# Patient Record
Sex: Female | Born: 1975 | Race: White | Hispanic: Yes | Marital: Married | Smoking: Never smoker
Health system: Southern US, Community
[De-identification: ages and names within clinical notes are randomized; demographics above are authoritative.]

## PROBLEM LIST (undated history)

## (undated) DIAGNOSIS — E7800 Pure hypercholesterolemia, unspecified: Secondary | ICD-10-CM

## (undated) DIAGNOSIS — F329 Major depressive disorder, single episode, unspecified: Secondary | ICD-10-CM

## (undated) DIAGNOSIS — I10 Essential (primary) hypertension: Secondary | ICD-10-CM

## (undated) DIAGNOSIS — B351 Tinea unguium: Secondary | ICD-10-CM

## (undated) DIAGNOSIS — F32A Depression, unspecified: Secondary | ICD-10-CM

## (undated) DIAGNOSIS — E119 Type 2 diabetes mellitus without complications: Secondary | ICD-10-CM

## (undated) HISTORY — DX: Type 2 diabetes mellitus without complications: E11.9

## (undated) HISTORY — DX: Tinea unguium: B35.1

## (undated) HISTORY — DX: Essential (primary) hypertension: I10

## (undated) HISTORY — DX: Pure hypercholesterolemia, unspecified: E78.00

---

## 2003-07-11 ENCOUNTER — Encounter: Admission: RE | Admit: 2003-07-11 | Discharge: 2003-07-11 | Payer: Self-pay | Admitting: Internal Medicine

## 2003-08-28 ENCOUNTER — Encounter: Admission: RE | Admit: 2003-08-28 | Discharge: 2003-08-28 | Payer: Self-pay | Admitting: Internal Medicine

## 2003-11-30 ENCOUNTER — Encounter: Admission: RE | Admit: 2003-11-30 | Discharge: 2003-11-30 | Payer: Self-pay | Admitting: Internal Medicine

## 2003-12-01 ENCOUNTER — Encounter: Admission: RE | Admit: 2003-12-01 | Discharge: 2003-12-01 | Payer: Self-pay | Admitting: Internal Medicine

## 2004-02-21 ENCOUNTER — Ambulatory Visit: Payer: Self-pay

## 2006-08-07 ENCOUNTER — Emergency Department (HOSPITAL_COMMUNITY): Admission: EM | Admit: 2006-08-07 | Discharge: 2006-08-07 | Payer: Self-pay | Admitting: Emergency Medicine

## 2006-08-19 ENCOUNTER — Inpatient Hospital Stay (HOSPITAL_COMMUNITY): Admission: AD | Admit: 2006-08-19 | Discharge: 2006-08-19 | Payer: Self-pay | Admitting: Obstetrics and Gynecology

## 2006-10-27 ENCOUNTER — Encounter: Admission: RE | Admit: 2006-10-27 | Discharge: 2006-11-19 | Payer: Self-pay | Admitting: Orthopedic Surgery

## 2006-11-13 ENCOUNTER — Inpatient Hospital Stay (HOSPITAL_COMMUNITY): Admission: AD | Admit: 2006-11-13 | Discharge: 2006-11-13 | Payer: Self-pay | Admitting: Family Medicine

## 2007-01-01 ENCOUNTER — Encounter: Admission: RE | Admit: 2007-01-01 | Discharge: 2007-01-01 | Payer: Self-pay | Admitting: Obstetrics

## 2007-06-21 ENCOUNTER — Inpatient Hospital Stay (HOSPITAL_COMMUNITY): Admission: AD | Admit: 2007-06-21 | Discharge: 2007-06-24 | Payer: Self-pay | Admitting: Obstetrics

## 2007-06-21 ENCOUNTER — Encounter (INDEPENDENT_AMBULATORY_CARE_PROVIDER_SITE_OTHER): Payer: Self-pay | Admitting: Obstetrics

## 2007-09-06 ENCOUNTER — Encounter: Admission: RE | Admit: 2007-09-06 | Discharge: 2007-09-06 | Payer: Self-pay | Admitting: Obstetrics

## 2007-11-15 ENCOUNTER — Ambulatory Visit: Payer: Self-pay | Admitting: Family Medicine

## 2007-11-15 DIAGNOSIS — E669 Obesity, unspecified: Secondary | ICD-10-CM

## 2007-11-15 DIAGNOSIS — E1110 Type 2 diabetes mellitus with ketoacidosis without coma: Secondary | ICD-10-CM

## 2007-11-15 DIAGNOSIS — N63 Unspecified lump in unspecified breast: Secondary | ICD-10-CM | POA: Insufficient documentation

## 2007-11-15 LAB — CONVERTED CEMR LAB: Hgb A1c MFr Bld: 8.6 %

## 2007-12-13 ENCOUNTER — Ambulatory Visit: Payer: Self-pay | Admitting: Family Medicine

## 2007-12-13 DIAGNOSIS — D2360 Other benign neoplasm of skin of unspecified upper limb, including shoulder: Secondary | ICD-10-CM

## 2007-12-13 LAB — CONVERTED CEMR LAB
ALT: 11 U/L
AST: 13 U/L
Albumin: 4.5 g/dL
Alkaline Phosphatase: 61 U/L
BUN: 9 mg/dL
CO2: 23 meq/L
Calcium: 9.1 mg/dL
Chloride: 102 meq/L
Creatinine, Ser: 0.6 mg/dL
Glucose, Bld: 205 mg/dL — ABNORMAL HIGH
Potassium: 4.1 meq/L
Sodium: 138 meq/L
Total Bilirubin: 0.5 mg/dL
Total Protein: 8 g/dL

## 2008-01-12 ENCOUNTER — Encounter: Payer: Self-pay | Admitting: Family Medicine

## 2008-01-31 ENCOUNTER — Ambulatory Visit: Payer: Self-pay | Admitting: Family Medicine

## 2008-01-31 LAB — CONVERTED CEMR LAB
HDL: 62 mg/dL (ref >39)
Total CHOL/HDL Ratio: 3.1
VLDL: 18 mg/dL (ref 0–40)

## 2008-03-01 ENCOUNTER — Encounter: Payer: Self-pay | Admitting: Family Medicine

## 2008-03-14 ENCOUNTER — Ambulatory Visit: Payer: Self-pay | Admitting: Family Medicine

## 2008-03-27 ENCOUNTER — Ambulatory Visit: Payer: Self-pay | Admitting: Family Medicine

## 2008-03-27 DIAGNOSIS — B353 Tinea pedis: Secondary | ICD-10-CM

## 2008-04-03 ENCOUNTER — Ambulatory Visit: Payer: Self-pay | Admitting: Family Medicine

## 2008-04-10 ENCOUNTER — Ambulatory Visit: Payer: Self-pay | Admitting: Family Medicine

## 2008-04-12 ENCOUNTER — Telehealth (INDEPENDENT_AMBULATORY_CARE_PROVIDER_SITE_OTHER): Payer: Self-pay | Admitting: Family Medicine

## 2008-06-05 ENCOUNTER — Encounter: Admission: RE | Admit: 2008-06-05 | Discharge: 2008-06-05 | Payer: Self-pay | Admitting: Surgery

## 2008-06-07 ENCOUNTER — Encounter (INDEPENDENT_AMBULATORY_CARE_PROVIDER_SITE_OTHER): Payer: Self-pay | Admitting: Surgery

## 2008-06-07 ENCOUNTER — Encounter: Admission: RE | Admit: 2008-06-07 | Discharge: 2008-06-07 | Payer: Self-pay | Admitting: Surgery

## 2008-06-08 HISTORY — PX: BREAST BIOPSY: SHX20

## 2008-09-18 ENCOUNTER — Encounter: Admission: RE | Admit: 2008-09-18 | Discharge: 2008-12-17 | Payer: Self-pay | Admitting: Family Medicine

## 2008-09-18 ENCOUNTER — Ambulatory Visit: Payer: Self-pay | Admitting: Family Medicine

## 2008-09-18 LAB — CONVERTED CEMR LAB
ALT: 13 units/L (ref 0–35)
Albumin: 4 g/dL (ref 3.5–5.2)
Alkaline Phosphatase: 54 units/L (ref 39–117)
Total Protein: 7.7 g/dL (ref 6.0–8.3)

## 2008-09-21 ENCOUNTER — Encounter: Payer: Self-pay | Admitting: Family Medicine

## 2008-09-21 ENCOUNTER — Ambulatory Visit: Payer: Self-pay | Admitting: Obstetrics and Gynecology

## 2008-09-21 LAB — CONVERTED CEMR LAB
Creatinine 24 HR UR: 1130 mg/24hr (ref 700–1800)
Creatinine Clearance: 179 mL/min — ABNORMAL HIGH (ref 75–115)

## 2008-09-25 ENCOUNTER — Ambulatory Visit: Payer: Self-pay | Admitting: Obstetrics & Gynecology

## 2008-09-28 ENCOUNTER — Ambulatory Visit (HOSPITAL_COMMUNITY): Admission: RE | Admit: 2008-09-28 | Discharge: 2008-09-28 | Payer: Self-pay | Admitting: Family Medicine

## 2008-10-05 ENCOUNTER — Ambulatory Visit: Payer: Self-pay | Admitting: Obstetrics & Gynecology

## 2008-10-09 ENCOUNTER — Ambulatory Visit: Payer: Self-pay | Admitting: Family Medicine

## 2008-10-19 ENCOUNTER — Ambulatory Visit: Payer: Self-pay | Admitting: Obstetrics & Gynecology

## 2008-10-19 ENCOUNTER — Ambulatory Visit (HOSPITAL_COMMUNITY): Admission: RE | Admit: 2008-10-19 | Discharge: 2008-10-19 | Payer: Self-pay | Admitting: Obstetrics & Gynecology

## 2008-10-30 ENCOUNTER — Ambulatory Visit: Payer: Self-pay | Admitting: Obstetrics & Gynecology

## 2008-11-06 ENCOUNTER — Ambulatory Visit: Payer: Self-pay | Admitting: Obstetrics & Gynecology

## 2008-11-13 ENCOUNTER — Ambulatory Visit: Payer: Self-pay | Admitting: Obstetrics & Gynecology

## 2008-11-27 ENCOUNTER — Ambulatory Visit: Payer: Self-pay | Admitting: Obstetrics & Gynecology

## 2008-12-04 ENCOUNTER — Ambulatory Visit: Payer: Self-pay | Admitting: Obstetrics & Gynecology

## 2008-12-11 ENCOUNTER — Encounter: Admission: RE | Admit: 2008-12-11 | Discharge: 2009-03-11 | Payer: Self-pay | Admitting: Family Medicine

## 2008-12-11 ENCOUNTER — Ambulatory Visit: Payer: Self-pay | Admitting: Obstetrics & Gynecology

## 2008-12-12 ENCOUNTER — Ambulatory Visit (HOSPITAL_COMMUNITY): Admission: RE | Admit: 2008-12-12 | Discharge: 2008-12-12 | Payer: Self-pay | Admitting: Family Medicine

## 2008-12-18 ENCOUNTER — Ambulatory Visit: Payer: Self-pay | Admitting: Obstetrics & Gynecology

## 2009-01-01 ENCOUNTER — Ambulatory Visit: Payer: Self-pay | Admitting: Obstetrics & Gynecology

## 2009-01-01 ENCOUNTER — Encounter: Payer: Self-pay | Admitting: Family Medicine

## 2009-01-15 ENCOUNTER — Ambulatory Visit: Payer: Self-pay | Admitting: Obstetrics & Gynecology

## 2009-01-15 LAB — CONVERTED CEMR LAB
HCT: 35.5 % — ABNORMAL LOW (ref 36.0–46.0)
Hemoglobin: 11.5 g/dL — ABNORMAL LOW (ref 12.0–15.0)
MCV: 86.8 fL (ref 78.0–100.0)
RDW: 13.8 % (ref 11.5–15.5)
WBC: 10.1 10*3/uL (ref 4.0–10.5)

## 2009-01-16 ENCOUNTER — Ambulatory Visit (HOSPITAL_COMMUNITY): Admission: RE | Admit: 2009-01-16 | Discharge: 2009-01-16 | Payer: Self-pay | Admitting: Obstetrics & Gynecology

## 2009-01-23 ENCOUNTER — Ambulatory Visit: Payer: Self-pay | Admitting: Obstetrics & Gynecology

## 2009-01-25 ENCOUNTER — Ambulatory Visit: Payer: Self-pay | Admitting: Obstetrics & Gynecology

## 2009-01-29 ENCOUNTER — Ambulatory Visit: Payer: Self-pay | Admitting: Obstetrics & Gynecology

## 2009-02-01 ENCOUNTER — Inpatient Hospital Stay (HOSPITAL_COMMUNITY): Admission: AD | Admit: 2009-02-01 | Discharge: 2009-02-01 | Payer: Self-pay | Admitting: Obstetrics & Gynecology

## 2009-02-01 ENCOUNTER — Ambulatory Visit: Payer: Self-pay | Admitting: Obstetrics & Gynecology

## 2009-02-01 ENCOUNTER — Ambulatory Visit: Payer: Self-pay | Admitting: Obstetrics and Gynecology

## 2009-02-05 ENCOUNTER — Ambulatory Visit: Payer: Self-pay | Admitting: Obstetrics & Gynecology

## 2009-02-08 ENCOUNTER — Ambulatory Visit: Payer: Self-pay | Admitting: Obstetrics & Gynecology

## 2009-02-08 ENCOUNTER — Ambulatory Visit (HOSPITAL_COMMUNITY): Admission: RE | Admit: 2009-02-08 | Discharge: 2009-02-08 | Payer: Self-pay | Admitting: Obstetrics & Gynecology

## 2009-02-12 ENCOUNTER — Ambulatory Visit: Payer: Self-pay | Admitting: Obstetrics & Gynecology

## 2009-02-15 ENCOUNTER — Ambulatory Visit: Payer: Self-pay | Admitting: Obstetrics & Gynecology

## 2009-02-19 ENCOUNTER — Ambulatory Visit: Payer: Self-pay | Admitting: Obstetrics & Gynecology

## 2009-02-19 ENCOUNTER — Encounter: Payer: Self-pay | Admitting: Family Medicine

## 2009-02-19 LAB — CONVERTED CEMR LAB
Chlamydia, Swab/Urine, PCR: NEGATIVE
GC Probe Amp, Urine: NEGATIVE

## 2009-02-22 ENCOUNTER — Ambulatory Visit: Payer: Self-pay | Admitting: Obstetrics & Gynecology

## 2009-02-26 ENCOUNTER — Ambulatory Visit: Payer: Self-pay | Admitting: Obstetrics & Gynecology

## 2009-03-01 ENCOUNTER — Ambulatory Visit: Payer: Self-pay | Admitting: Obstetrics & Gynecology

## 2009-03-01 ENCOUNTER — Inpatient Hospital Stay (HOSPITAL_COMMUNITY): Admission: AD | Admit: 2009-03-01 | Discharge: 2009-03-01 | Payer: Self-pay | Admitting: Obstetrics & Gynecology

## 2009-03-05 ENCOUNTER — Ambulatory Visit: Payer: Self-pay | Admitting: Obstetrics & Gynecology

## 2009-03-05 ENCOUNTER — Ambulatory Visit (HOSPITAL_COMMUNITY): Admission: RE | Admit: 2009-03-05 | Discharge: 2009-03-05 | Payer: Self-pay | Admitting: Obstetrics and Gynecology

## 2009-03-09 ENCOUNTER — Ambulatory Visit: Payer: Self-pay | Admitting: Obstetrics & Gynecology

## 2009-03-09 ENCOUNTER — Inpatient Hospital Stay (HOSPITAL_COMMUNITY): Admission: RE | Admit: 2009-03-09 | Discharge: 2009-03-12 | Payer: Self-pay | Admitting: Obstetrics & Gynecology

## 2010-01-06 IMAGING — US US OB FOLLOW-UP
1 series · 14 of 28 positions shown · non-contrast
Comparison: none

OBSTETRICAL ULTRASOUND:
 This ultrasound exam was performed in the [HOSPITAL] Ultrasound Department.  The OB US report was generated in the AS system, and faxed to the ordering physician.  This report is also available in [REDACTED] PACS.

[Series 1: us ob follow up · 32 acquisitions, 14 frames shown]
[im 2/32]
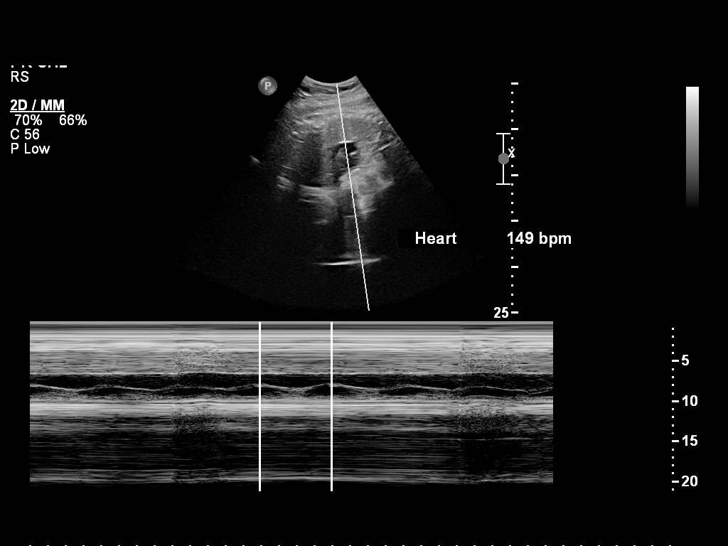
[im 4/32]
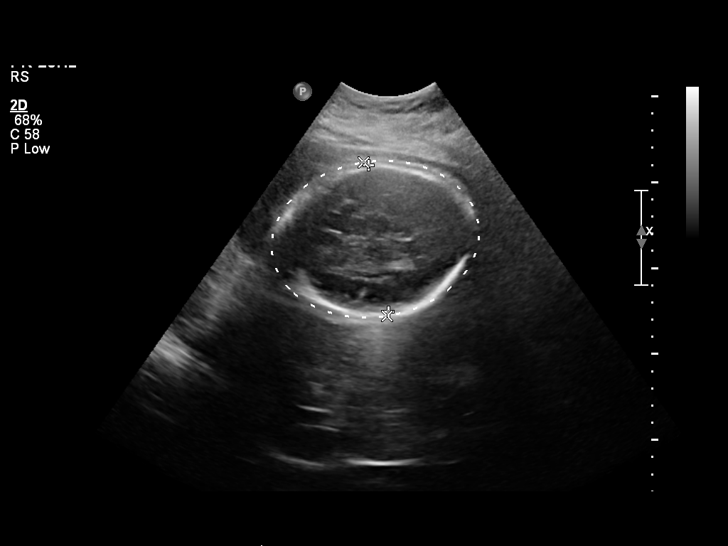
[im 6/32]
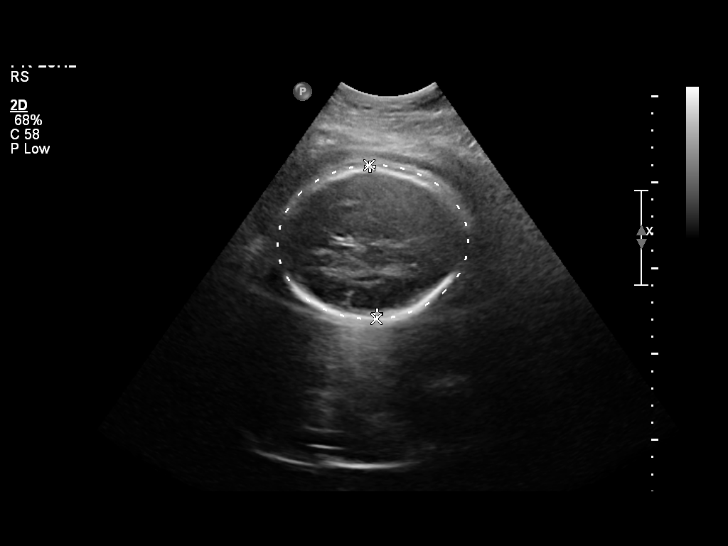
[im 9/32]
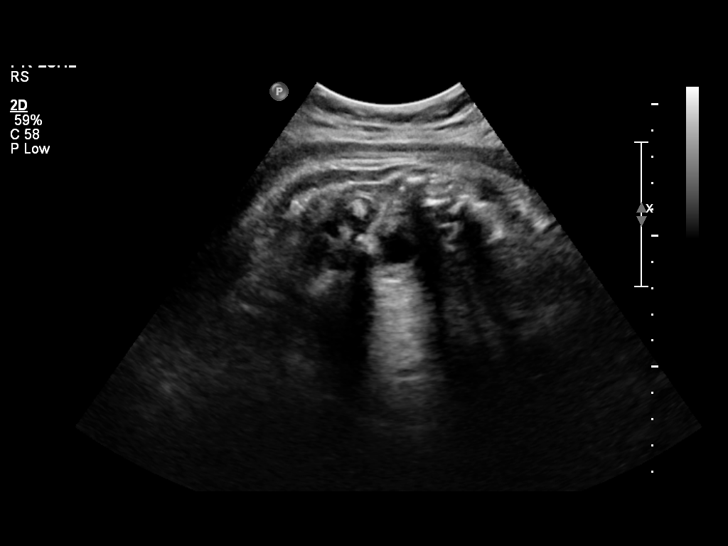
[im 11/32]
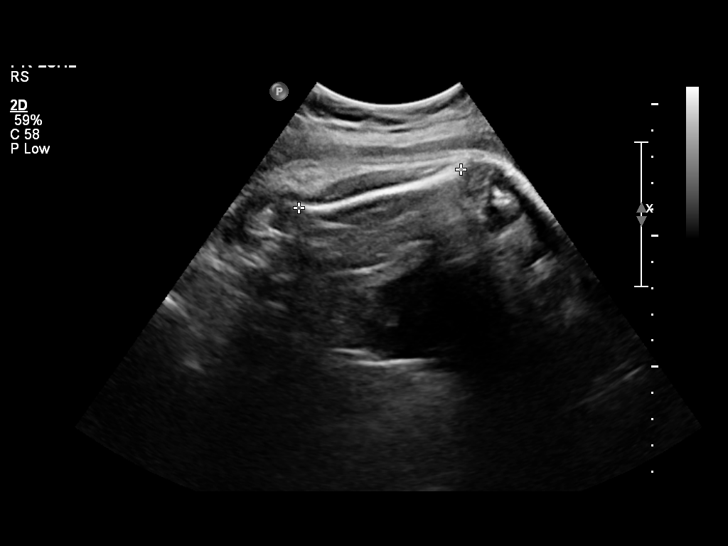
[im 13/32]
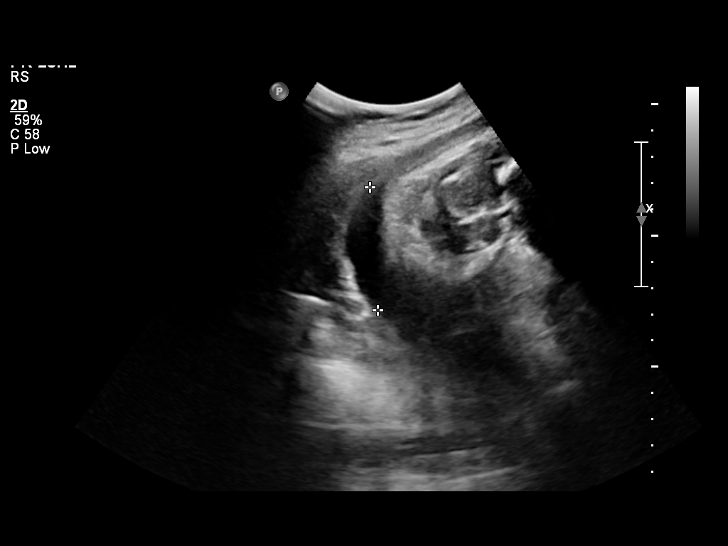
[im 15/32]
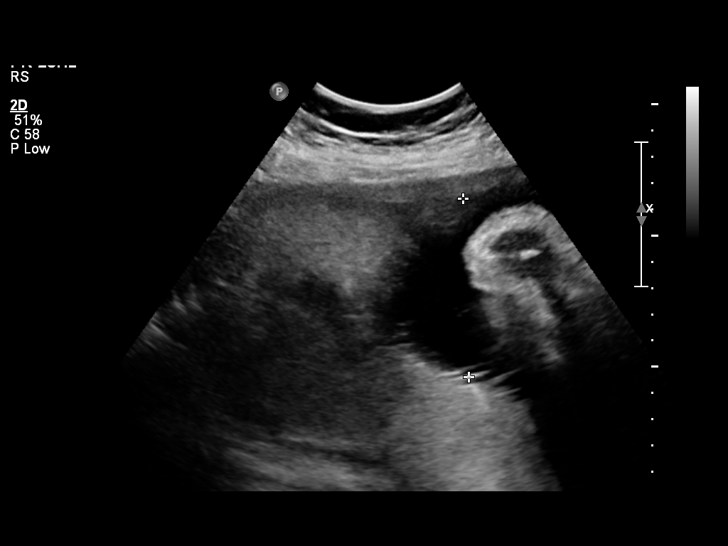
[im 18/32]
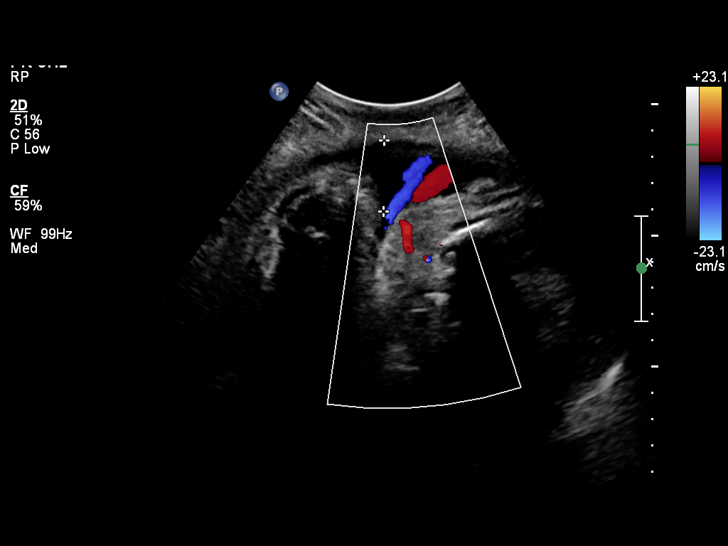
[im 20/32]
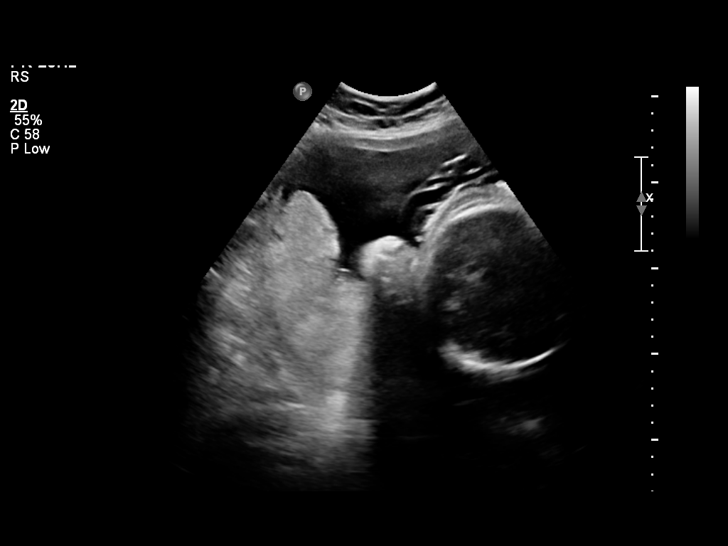
[im 22/32]
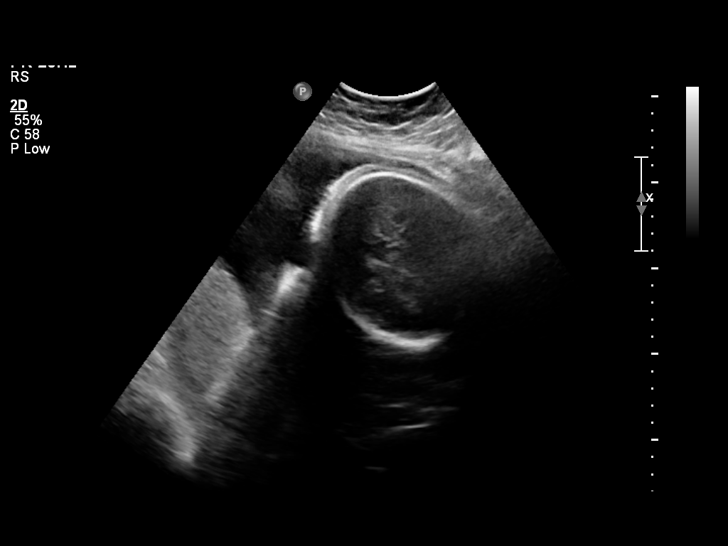
[im 25/32]
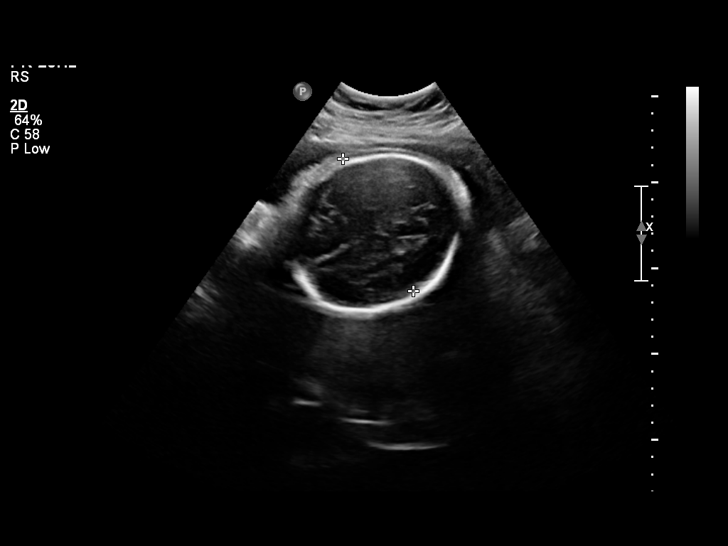
[im 27/32]
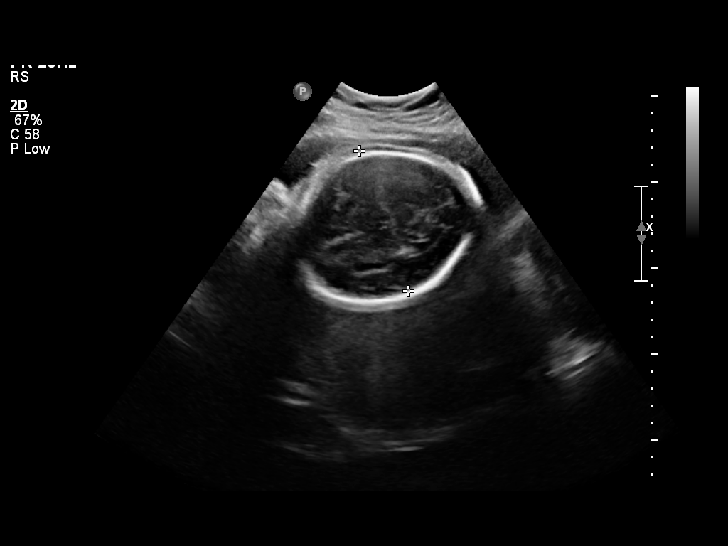
[im 29/32]
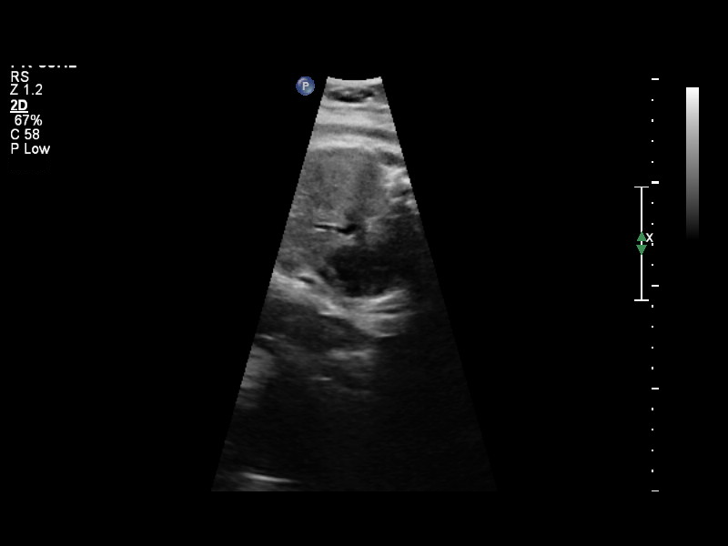
[im 32/32]
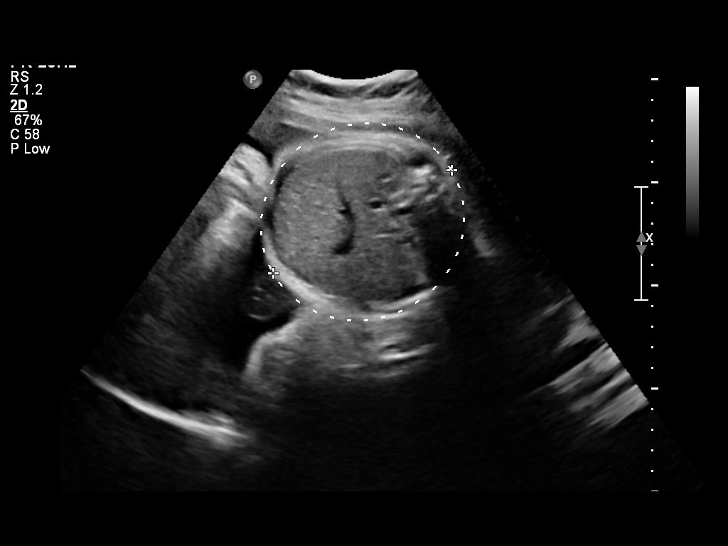

[14 of 28 positions shown; findings below may reference images not displayed]

IMPRESSION: See AS Obstetric US report.

## 2010-01-29 IMAGING — US US OB FOLLOW-UP
1 series · 14 of 28 positions shown · non-contrast
Comparison: none

OBSTETRICAL ULTRASOUND:
 This ultrasound exam was performed in the [HOSPITAL] Ultrasound Department.  The OB US report was generated in the AS system, and faxed to the ordering physician.  This report is also available in [REDACTED] PACS.

[Series 1: us ob follow up · 0.35mm/px · 29 acquisitions, 14 frames shown]
[im 2/29]
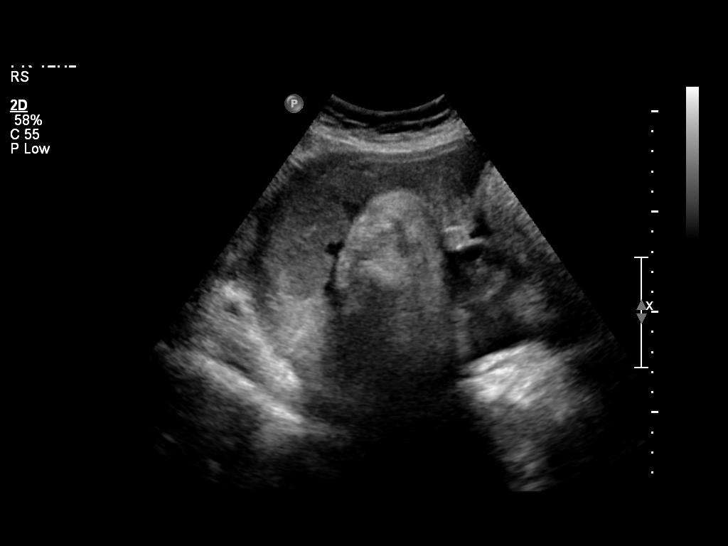
[im 4/29]
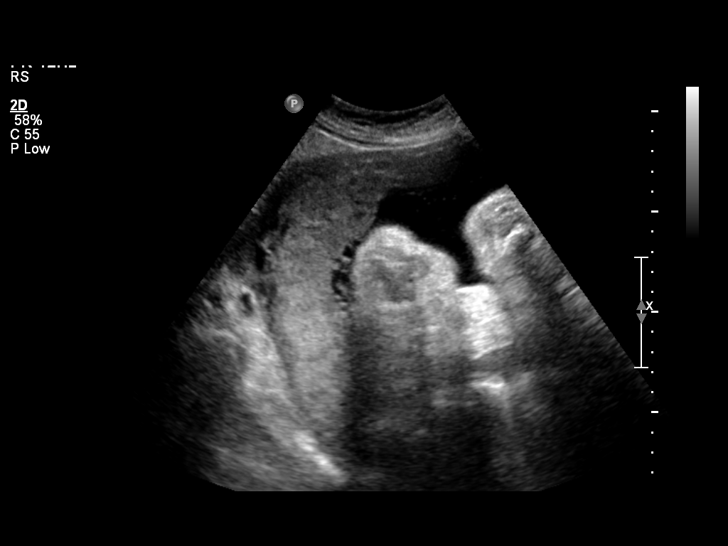
[im 6/29]
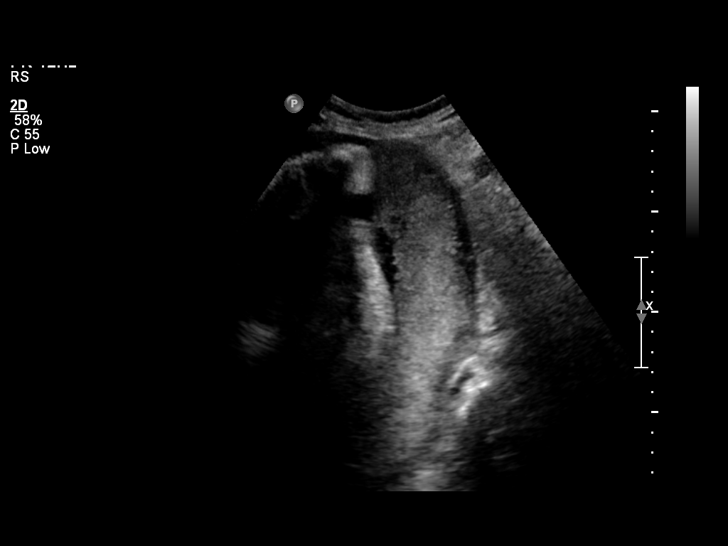
[im 8/29]
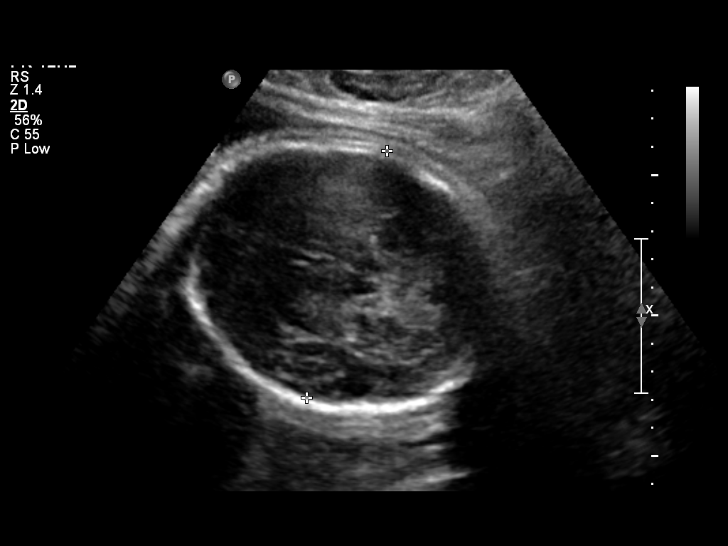
[im 10/29]
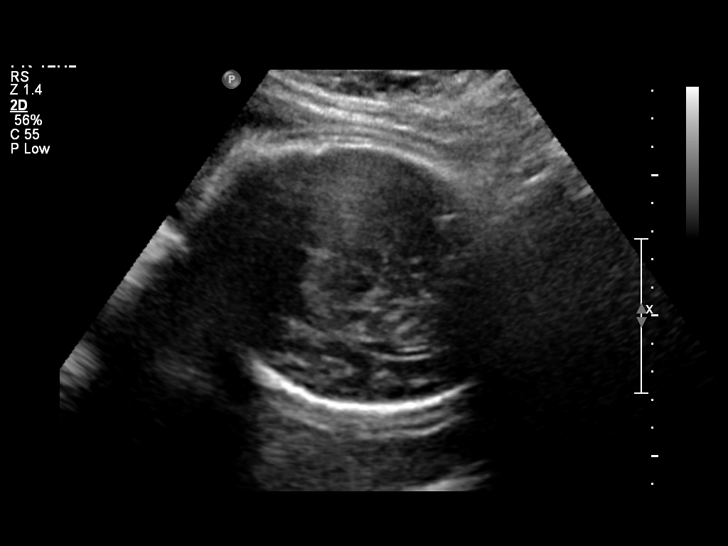
[im 12/29]
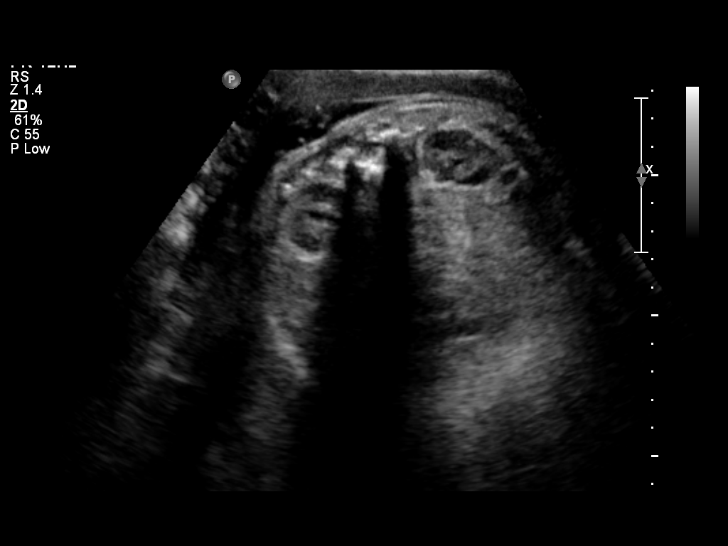
[im 14/29]
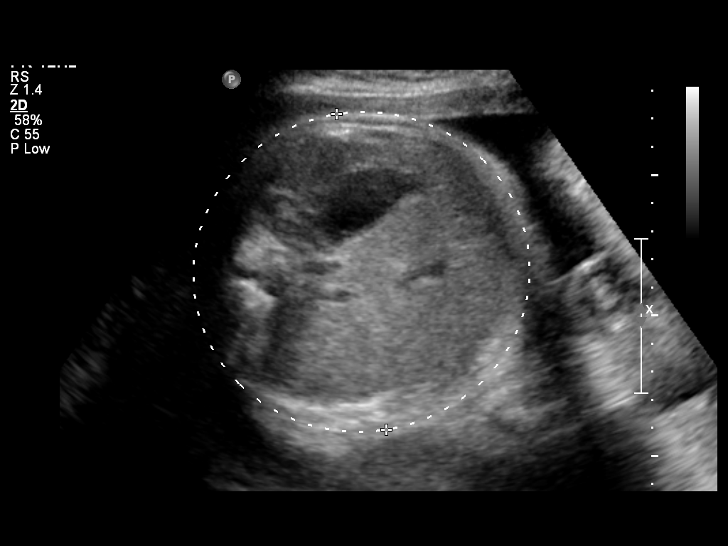
[im 16/29]
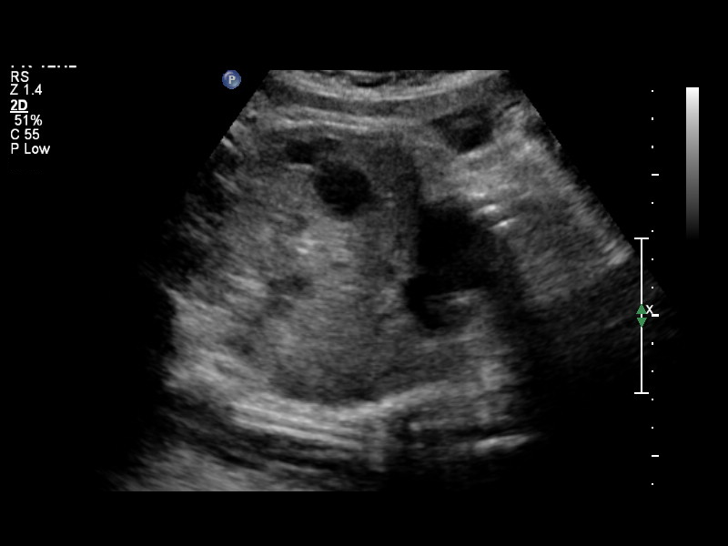
[im 18/29]
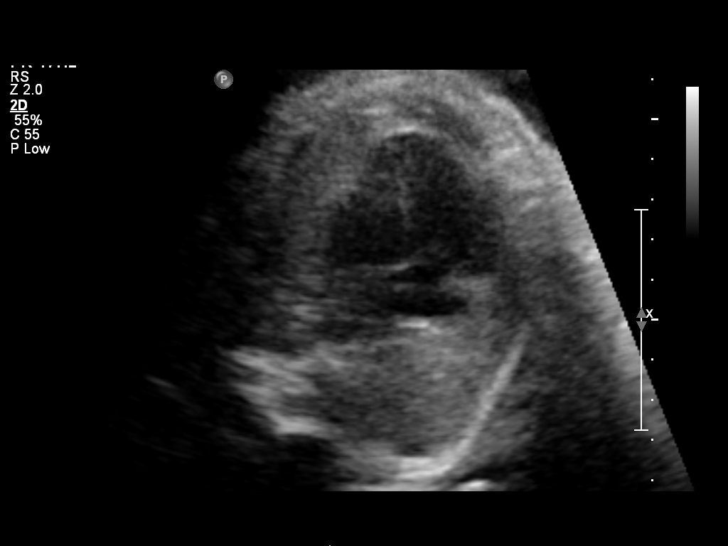
[im 20/29]
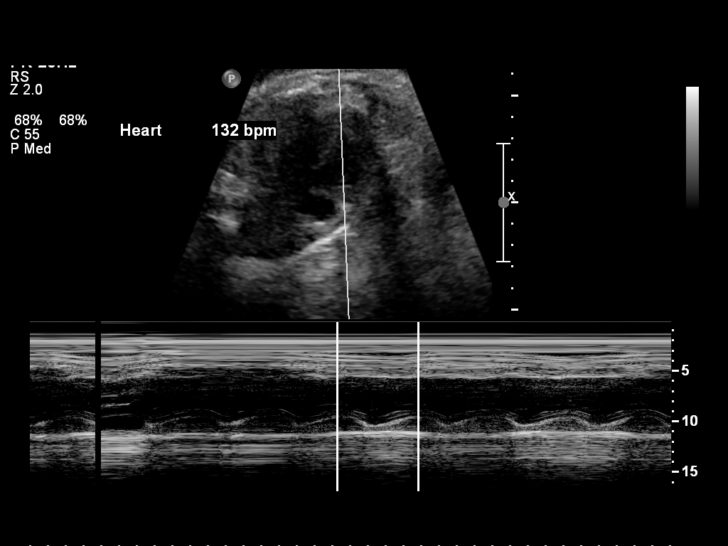
[im 22/29]
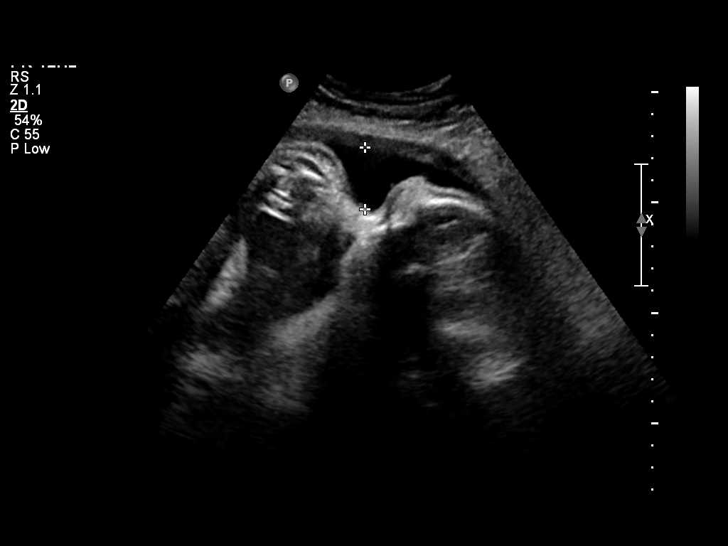
[im 24/29]
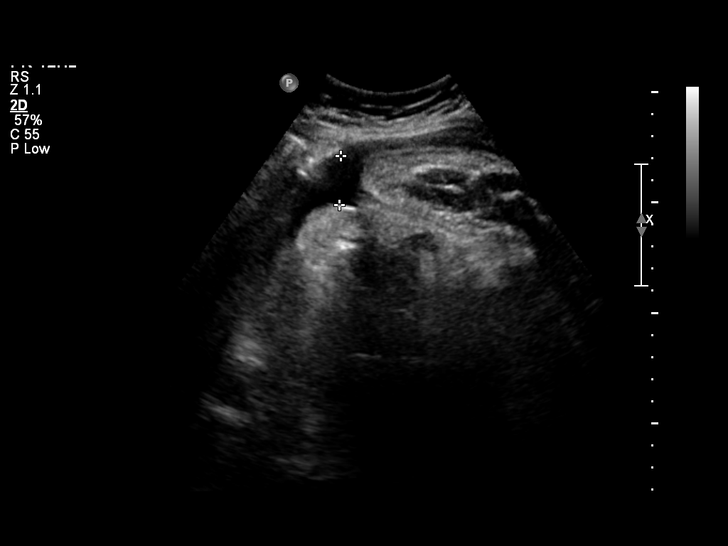
[im 26/29]
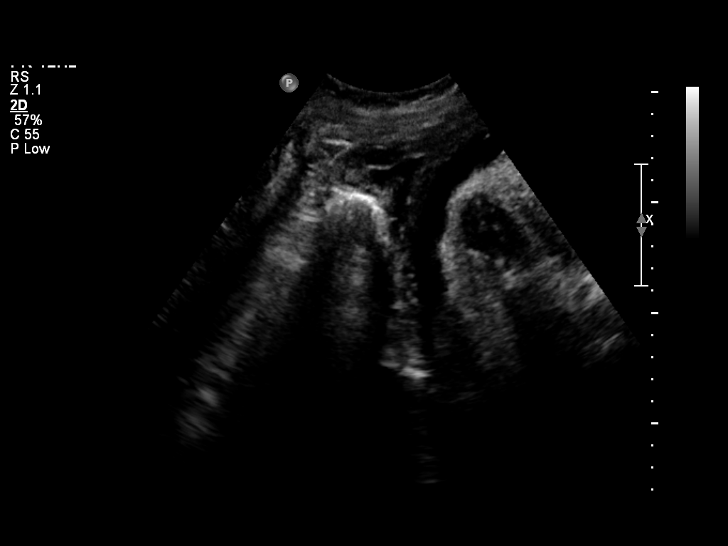
[im 29/29]
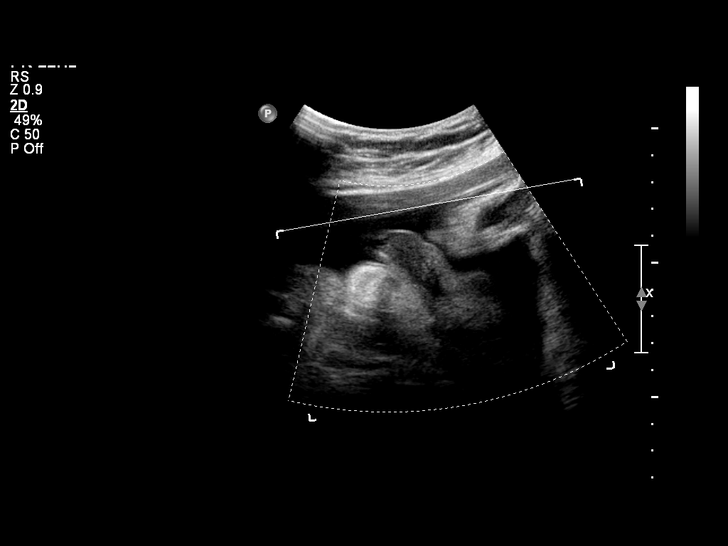

[14 of 28 positions shown; findings below may reference images not displayed]

IMPRESSION: See AS Obstetric US report.

## 2010-06-21 ENCOUNTER — Encounter: Payer: Self-pay | Admitting: *Deleted

## 2010-08-22 LAB — GLUCOSE, CAPILLARY
Glucose-Capillary: 131 mg/dL — ABNORMAL HIGH (ref 70–99)
Glucose-Capillary: 159 mg/dL — ABNORMAL HIGH (ref 70–99)
Glucose-Capillary: 207 mg/dL — ABNORMAL HIGH (ref 70–99)
Glucose-Capillary: 62 mg/dL — ABNORMAL LOW (ref 70–99)
Glucose-Capillary: 66 mg/dL — ABNORMAL LOW (ref 70–99)
Glucose-Capillary: 73 mg/dL (ref 70–99)
Glucose-Capillary: 107 mg/dL — ABNORMAL HIGH (ref 70–99)
Glucose-Capillary: 77 mg/dL (ref 70–99)

## 2010-08-22 LAB — CBC
HCT: 27.7 % — ABNORMAL LOW (ref 36.0–46.0)
MCHC: 33.1 g/dL (ref 30.0–36.0)
MCHC: 33.2 g/dL (ref 30.0–36.0)
MCV: 86.7 fL (ref 78.0–100.0)
Platelets: 196 10*3/uL (ref 150–400)
Platelets: 237 10*3/uL (ref 150–400)
RBC: 3.99 MIL/uL (ref 3.87–5.11)
RDW: 15.2 % (ref 11.5–15.5)
WBC: 10.6 10*3/uL — ABNORMAL HIGH (ref 4.0–10.5)
WBC: 7.9 10*3/uL (ref 4.0–10.5)

## 2010-08-22 LAB — COMPREHENSIVE METABOLIC PANEL
Albumin: 2.6 g/dL — ABNORMAL LOW (ref 3.5–5.2)
Alkaline Phosphatase: 108 U/L (ref 39–117)
Calcium: 8.3 mg/dL — ABNORMAL LOW (ref 8.4–10.5)
Chloride: 107 mEq/L (ref 96–112)
Creatinine, Ser: 0.51 mg/dL (ref 0.4–1.2)
GFR calc Af Amer: >60 mL/min (ref >60)
Total Protein: 6.4 g/dL (ref 6.0–8.3)

## 2010-08-22 LAB — POCT URINALYSIS DIP (DEVICE)
Bilirubin Urine: NEGATIVE
Bilirubin Urine: NEGATIVE
Bilirubin Urine: NEGATIVE
Glucose, UA: NEGATIVE mg/dL
Ketones, ur: NEGATIVE mg/dL
Ketones, ur: NEGATIVE mg/dL
Specific Gravity, Urine: 1.01 (ref 1.005–1.030)
pH: 6.5 (ref 5.0–8.0)

## 2010-08-22 LAB — RPR: RPR Ser Ql: NONREACTIVE

## 2010-08-23 LAB — POCT URINALYSIS DIP (DEVICE)
Glucose, UA: NEGATIVE mg/dL
Hgb urine dipstick: NEGATIVE
Ketones, ur: NEGATIVE mg/dL
Nitrite: NEGATIVE
Protein, ur: NEGATIVE mg/dL
Protein, ur: NEGATIVE mg/dL
Protein, ur: NEGATIVE mg/dL
Specific Gravity, Urine: 1.02 (ref 1.005–1.030)
Urobilinogen, UA: 0.2 mg/dL (ref 0.0–1.0)
Urobilinogen, UA: 1 mg/dL (ref 0.0–1.0)
pH: 6.5 (ref 5.0–8.0)
pH: 7 (ref 5.0–8.0)
pH: 6 (ref 5.0–8.0)

## 2010-08-23 LAB — URINE MICROSCOPIC-ADD ON

## 2010-08-23 LAB — WET PREP, GENITAL
Clue Cells Wet Prep HPF POC: NONE SEEN
Trich, Wet Prep: NONE SEEN

## 2010-08-23 LAB — URINALYSIS, ROUTINE W REFLEX MICROSCOPIC
Nitrite: NEGATIVE
Specific Gravity, Urine: 1.015 (ref 1.005–1.030)
Urobilinogen, UA: 1 mg/dL (ref 0.0–1.0)

## 2010-08-23 LAB — GC/CHLAMYDIA PROBE AMP, GENITAL
Chlamydia, DNA Probe: NEGATIVE
GC Probe Amp, Genital: NEGATIVE

## 2010-08-24 LAB — POCT URINALYSIS DIP (DEVICE)
Bilirubin Urine: NEGATIVE
Glucose, UA: NEGATIVE mg/dL
Nitrite: NEGATIVE
Nitrite: NEGATIVE
Nitrite: NEGATIVE
Protein, ur: 30 mg/dL — AB
Protein, ur: NEGATIVE mg/dL
Protein, ur: NEGATIVE mg/dL
Urobilinogen, UA: 0.2 mg/dL (ref 0.0–1.0)
Urobilinogen, UA: 4 mg/dL — ABNORMAL HIGH (ref 0.0–1.0)
Urobilinogen, UA: 0.2 mg/dL (ref 0.0–1.0)
pH: 6 (ref 5.0–8.0)
pH: 6.5 (ref 5.0–8.0)

## 2010-08-25 LAB — POCT URINALYSIS DIP (DEVICE)
Bilirubin Urine: NEGATIVE
Bilirubin Urine: NEGATIVE
Glucose, UA: NEGATIVE mg/dL
Glucose, UA: NEGATIVE mg/dL
Hgb urine dipstick: NEGATIVE
Hgb urine dipstick: NEGATIVE
Ketones, ur: NEGATIVE mg/dL
Nitrite: NEGATIVE
Nitrite: NEGATIVE
Nitrite: NEGATIVE
Protein, ur: NEGATIVE mg/dL
Specific Gravity, Urine: <=1.005 (ref 1.005–1.030)
Urobilinogen, UA: 0.2 mg/dL (ref 0.0–1.0)
Urobilinogen, UA: 0.2 mg/dL (ref 0.0–1.0)
pH: 5.5 (ref 5.0–8.0)
pH: 7 (ref 5.0–8.0)

## 2010-08-26 LAB — POCT URINALYSIS DIP (DEVICE)
Bilirubin Urine: NEGATIVE
Bilirubin Urine: NEGATIVE
Glucose, UA: NEGATIVE mg/dL
Glucose, UA: NEGATIVE mg/dL
Glucose, UA: NEGATIVE mg/dL
Ketones, ur: NEGATIVE mg/dL
Ketones, ur: 15 mg/dL — AB
Nitrite: NEGATIVE
Nitrite: NEGATIVE
Protein, ur: NEGATIVE mg/dL
Specific Gravity, Urine: 1.015 (ref 1.005–1.030)
Urobilinogen, UA: 0.2 mg/dL (ref 0.0–1.0)
Urobilinogen, UA: 0.2 mg/dL (ref 0.0–1.0)

## 2010-08-27 LAB — POCT URINALYSIS DIP (DEVICE)
Bilirubin Urine: NEGATIVE
Bilirubin Urine: NEGATIVE
Glucose, UA: NEGATIVE mg/dL
Glucose, UA: NEGATIVE mg/dL
Hgb urine dipstick: NEGATIVE
Ketones, ur: 15 mg/dL — AB
Ketones, ur: NEGATIVE mg/dL
Ketones, ur: NEGATIVE mg/dL
Nitrite: NEGATIVE
Protein, ur: NEGATIVE mg/dL
Specific Gravity, Urine: 1.015 (ref 1.005–1.030)
Specific Gravity, Urine: 1.015 (ref 1.005–1.030)
Specific Gravity, Urine: <=1.005 (ref 1.005–1.030)
pH: 6 (ref 5.0–8.0)

## 2010-10-01 NOTE — Op Note (Signed)
NAMEVirgel Garrison            ACCOUNT NO.:  [id number]   MEDICAL RECORD NO.:  [id number]          PATIENT TYPE:  INP   LOCATION:  9199                          FACILITY:  [facility]   PHYSICIAN:  Kathreen Cosier, M.D.DATE OF BIRTH:  August 21, 1975   DATE OF PROCEDURE:  06/21/2007  DATE OF DISCHARGE:                               OPERATIVE REPORT   PREOPERATIVE DIAGNOSES:  1. Intrauterine pregnancy at term.  2. Diabetic with macrosomic fetus by ultrasound and palpation.   POSTOPERATIVE DIAGNOSES:  1. Intrauterine pregnancy at term.  2. Diabetic with macrosomic fetus by ultrasound and palpation.   SURGEON:  Dr. Gaynell Face.   ANESTHESIA:  Spinal.   PROCEDURE:  The patient placed on the operating table in supine  position.  After spinal administered,  abdomen prepped and draped,  bladder emptied with Foley catheter.  Transverse suprapubic incision  made and carried down to rectus fascia.  Fascia cleaned and incised the  length of incision.  Recti muscles retracted laterally.  Peritoneum  incised longitudinally.  Transverse incision made in the visceral  peritoneum above the bladder.  Bladder mobilized inferiorly.  Transverse  lower uterine incision made.  Fluid was meconium stained.  She was  delivered from the OP position of a female Apgar of 5 and 8 weighing 10  pounds 2 ounces.  Team was in attendance.  The placenta was fundal,  removed manually and sent to pathology.  Uterine cavity cleaned with dry  laps.  Uterine incision closed in 1 layer with continuous suture of #1  chromic.  Hemostasis was satisfactory.  Bladder flap reattached with 2-0  chromic.  Uterus well contracted.  Tubes and ovaries normal.  Abdomen  closed in layers, peritoneum continuous suture of 0 chromic, fascia  continuous suture of 0 Dexon, skin closed with subcuticular stitch of 4-  0 Monocryl.  Blood loss was 800 mL.  The patient tolerated the procedure  well, taken to recovery room in good condition.          ______________________________  Kathreen Cosier, M.D.     BAM/MEDQ  D:  06/21/2007  T:  06/21/2007  Job:  [id number]

## 2010-10-04 NOTE — Discharge Summary (Signed)
NAMEVirgel Garrison            ACCOUNT NO.:  [id number]   MEDICAL RECORD NO.:  [id number]          PATIENT TYPE:  INP   LOCATION:  9319                          FACILITY:  [facility]   PHYSICIAN:  Kathreen Cosier, M.D.DATE OF BIRTH:  Jun 15, 1975   DATE OF ADMISSION:  06/21/2007  DATE OF DISCHARGE:  06/24/2007                               DISCHARGE SUMMARY   The patient is a 35 year old gravida 1, EDC July 04, 2007; who is a  diabetic on glyburide 10 mg in the a.m. and 5 mg in the p.m.  Sugars  were all normal.  She was followed at [facility] with a nonstress  test VT.  Clinically she had a large baby and on admission ultrasound  gave an estimated fetal weight at 11 pounds.  It showed the cervix was 2  cm and the vertex was floating.  It was decided she would be delivered  by a low transverse cesarean section because of being a diabetic with a  macrosomic baby and ultrasound.  She had a C-section and had a 10 pound  2 ounce female; Apgar of 5 and 8.  Thin meconium fluid.   Postoperatively her hemoglobin was 8.7.  RPR negative, HIV negative.  She did well and was discharged home on the third postoperative day,  ambulatory on Tylox for pain and ferrous sulfate.   DISCHARGE DIAGNOSIS:  Status post elective low transverse cesarean  section at term, because of fetal macrosomia and being a diabetic.           ______________________________  Kathreen Cosier, M.D.     BAM/MEDQ  D:  07/07/2007  T:  07/07/2007  Job:  [id number]

## 2011-02-07 LAB — CBC
Hemoglobin: 8.7 — ABNORMAL LOW
MCV: 86.4
Platelets: 213
RBC: 2.86 — ABNORMAL LOW
RDW: 14.2
WBC: 13 — ABNORMAL HIGH

## 2011-02-07 LAB — RPR: RPR Ser Ql: NONREACTIVE

## 2011-03-05 LAB — URINALYSIS, ROUTINE W REFLEX MICROSCOPIC
Hgb urine dipstick: NEGATIVE
Specific Gravity, Urine: 1.01
Urobilinogen, UA: 0.2

## 2011-03-05 LAB — HCG, QUANTITATIVE, PREGNANCY: hCG, Beta Chain, Quant, S: 17979 — ABNORMAL HIGH

## 2011-03-05 LAB — GC/CHLAMYDIA PROBE AMP, GENITAL
Chlamydia, DNA Probe: NEGATIVE
GC Probe Amp, Genital: NEGATIVE

## 2011-03-05 LAB — CBC
HCT: 40.3
Hemoglobin: 13.3
RDW: 12.8
WBC: 11 — ABNORMAL HIGH

## 2011-03-05 LAB — DIFFERENTIAL
Basophils Absolute: 0
Eosinophils Relative: 1
Lymphocytes Relative: 23
Lymphs Abs: 2.6
Monocytes Absolute: 0.6
Neutro Abs: 7.6

## 2011-03-05 LAB — WET PREP, GENITAL
Clue Cells Wet Prep HPF POC: NONE SEEN
Trich, Wet Prep: NONE SEEN
Yeast Wet Prep HPF POC: NONE SEEN

## 2012-12-31 ENCOUNTER — Ambulatory Visit: Payer: Self-pay | Attending: Internal Medicine

## 2013-01-11 ENCOUNTER — Ambulatory Visit: Payer: Self-pay

## 2013-01-12 ENCOUNTER — Ambulatory Visit: Payer: No Typology Code available for payment source | Attending: Internal Medicine | Admitting: Internal Medicine

## 2013-01-12 VITALS — BP 140/78 | HR 78 | Temp 98.8°F | Resp 16 | Wt 158.0 lb

## 2013-01-12 DIAGNOSIS — E119 Type 2 diabetes mellitus without complications: Secondary | ICD-10-CM

## 2013-01-12 MED ORDER — GLUCOSE BLOOD VI STRP: ORAL_STRIP | Status: DC

## 2013-01-12 MED ORDER — FREESTYLE SYSTEM KIT: 1.0000 | PACK | Status: DC | PRN

## 2013-01-12 MED ORDER — METFORMIN HCL 1000 MG PO TABS: 1000.0000 mg | ORAL_TABLET | Freq: Two times a day (BID) | ORAL | Status: DC

## 2013-01-12 NOTE — Progress Notes (Signed)
Patient ID: Alyssa Garrison, female   DOB: 1975/05/27, 37 y.o.   MRN: [id number]  CC:  HPI: 37 year old female who is here to establish care. She has a history of diabetes and wasn't glyburide/metformin and discontinue these medications for years ago. The patient's mother is a diabetic and she has been using her mother's metformin on and off. She also uses a glucometer on and off and occasionally her sugars have been greater than 200 in the morning. The patient has felt poorly and has had blurry vision. She also complains of polyuria and polydipsia. She denies any chest pain    No Known Allergies No past medical history on file. Current Outpatient Prescriptions on File Prior to Visit  Medication Sig Dispense Refill  . metFORMIN (GLUCOPHAGE) 1000 MG tablet 1 tablet by mouth in the morning and 1 tablet in the evening.       Marland Kitchen aspirin 81 MG tablet Take 81 mg by mouth daily.        Marland Kitchen terbinafine (LAMISIL) 250 MG tablet Take 250 mg by mouth daily.         No current facility-administered medications on file prior to visit.   No family history on file. History   Social History  . Marital Status: Single    Spouse Name: N/A    Number of Children: N/A  . Years of Education: N/A   Occupational History  . Not on file.   Social History Main Topics  . Smoking status: Not on file  . Smokeless tobacco: Not on file  . Alcohol Use: Not on file  . Drug Use: Not on file  . Sexual Activity: Not on file   Other Topics Concern  . Not on file   Social History Narrative  . No narrative on file    Review of Systems  Constitutional: Negative for fever, chills, diaphoresis, activity change, appetite change and fatigue.  HENT: Negative for ear pain, nosebleeds, congestion, facial swelling, rhinorrhea, neck pain, neck stiffness and ear discharge.   Eyes: Negative for pain, discharge, redness, itching and visual disturbance.  Respiratory: Negative for cough, choking, chest tightness,  shortness of breath, wheezing and stridor.   Cardiovascular: Negative for chest pain, palpitations and leg swelling.  Gastrointestinal: Negative for abdominal distention.  Genitourinary: Negative for dysuria, urgency, frequency, hematuria, flank pain, decreased urine volume, difficulty urinating and dyspareunia.  Musculoskeletal: Negative for back pain, joint swelling, arthralgias and gait problem.  Neurological: Negative for dizziness, tremors, seizures, syncope, facial asymmetry, speech difficulty, weakness, light-headedness, numbness and headaches.  Hematological: Negative for adenopathy. Does not bruise/bleed easily.  Psychiatric/Behavioral: Negative for hallucinations, behavioral problems, confusion, dysphoric mood, decreased concentration and agitation.    Objective:   Filed Vitals:   01/12/13 1746  BP: 140/78  Pulse: 78  Temp: 98.8 F (37.1 C)  Resp: 16    Physical Exam  Constitutional: Appears well-developed and well-nourished. No distress.  HENT: Normocephalic. External right and left ear normal. Oropharynx is clear and moist.  Eyes: Conjunctivae and EOM are normal. PERRLA, no scleral icterus.  Neck: Normal ROM. Neck supple. No JVD. No tracheal deviation. No thyromegaly.  CVS: RRR, S1/S2 +, no murmurs, no gallops, no carotid bruit.  Pulmonary: Effort and breath sounds normal, no stridor, rhonchi, wheezes, rales.  Abdominal: Soft. BS +,  no distension, tenderness, rebound or guarding.  Musculoskeletal: Normal range of motion. No edema and no tenderness.  Lymphadenopathy: No lymphadenopathy noted, cervical, inguinal. Neuro: Alert. Normal reflexes, muscle tone coordination. No cranial nerve deficit.  Skin: Skin is warm and dry. No rash noted. Not diaphoretic. No erythema. No pallor.  Psychiatric: Normal mood and affect. Behavior, judgment, thought content normal.   Lab Results  Component Value Date   WBC 10.6* 03/10/2009   HGB 9.2 DELTA CHECK NOTED* 03/10/2009   HCT 27.7*  03/10/2009   MCV 86.7 03/10/2009   PLT 196 03/10/2009   Lab Results  Component Value Date   CREATININE 0.51 03/08/2009   BUN 8 03/08/2009   NA 136 03/08/2009   K 4.0 03/08/2009   CL 107 03/08/2009   CO2 23 03/08/2009    Lab Results  Component Value Date   HGBA1C 7.1* 09/18/2008   Lipid Panel     Component Value Date/Time   CHOL 195 01/31/2008 2020   TRIG 92 01/31/2008 2020   HDL 62 01/31/2008 2020   CHOLHDL 3.1 Ratio 01/31/2008 2020   VLDL 18 01/31/2008 2020   LDLCALC 115* 01/31/2008 2020       Assessment and plan:   Patient Active Problem List   Diagnosis Date Noted  . DERMATOPHYTOSIS OF FOOT 03/27/2008  . 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 DM 11/15/2007  . EXOGENOUS OBESITY 11/15/2007  . BREAST MASS, LEFT 11/15/2007   Diabetes mellitus, hemoglobin A1c pending Patient has been prescribed metformin thousand milligrams by mouth twice a day Glucometer as well as glucometer strips She will see [place] back in 2 weeks She has been asked to monitor his sugar on a 3 times a day basis  Ophthalmology referral has been provided for her blurry vision, possible diabetic retinopathy      The patient was given clear instructions to go to ER or return to medical center if symptoms don't improve, worsen or new problems develop. The patient verbalized understanding. The patient was told to call to get any lab results if not heard anything in the next week.

## 2013-01-12 NOTE — Progress Notes (Signed)
Pt here to establish care for Hx Diabetes Has not taken meds since 2 yrs. ago  Last CBG 180 taking her mother Metformin

## 2013-01-13 LAB — CBC WITH DIFFERENTIAL/PLATELET
Eosinophils Absolute: 0.1 10*3/uL (ref 0.0–0.7)
Eosinophils Relative: 2 % (ref 0–5)
HCT: 36 % (ref 36.0–46.0)
Lymphocytes Relative: 32 % (ref 12–46)
Lymphs Abs: 2.8 10*3/uL (ref 0.7–4.0)
MCH: 26.5 pg (ref 26.0–34.0)
MCV: 78.8 fL (ref 78.0–100.0)
Monocytes Absolute: 0.6 10*3/uL (ref 0.1–1.0)
Monocytes Relative: 7 % (ref 3–12)
RBC: 4.57 MIL/uL (ref 3.87–5.11)
WBC: 8.9 10*3/uL (ref 4.0–10.5)

## 2013-01-13 LAB — LIPID PANEL
Cholesterol: 169 mg/dL (ref 0–200)
HDL: 53 mg/dL (ref >39)
Total CHOL/HDL Ratio: 3.2 Ratio
Triglycerides: 98 mg/dL (ref <150)

## 2013-01-13 LAB — COMPREHENSIVE METABOLIC PANEL
ALT: 11 U/L (ref 0–35)
BUN: 9 mg/dL (ref 6–23)
CO2: 24 mEq/L (ref 19–32)
Calcium: 9.4 mg/dL (ref 8.4–10.5)
Chloride: 102 mEq/L (ref 96–112)
Creat: 0.6 mg/dL (ref 0.50–1.10)
Glucose, Bld: 218 mg/dL — ABNORMAL HIGH (ref 70–99)
Total Bilirubin: 0.4 mg/dL (ref 0.3–1.2)

## 2013-01-13 LAB — TSH: TSH: 3.242 u[IU]/mL (ref 0.350–4.500)

## 2013-01-24 ENCOUNTER — Ambulatory Visit: Payer: No Typology Code available for payment source | Attending: Family Medicine | Admitting: Internal Medicine

## 2013-01-24 VITALS — BP 160/104 | HR 75 | Temp 97.2°F | Resp 18 | Wt 157.0 lb

## 2013-01-24 DIAGNOSIS — E119 Type 2 diabetes mellitus without complications: Secondary | ICD-10-CM

## 2013-01-24 DIAGNOSIS — I10 Essential (primary) hypertension: Secondary | ICD-10-CM | POA: Insufficient documentation

## 2013-01-24 DIAGNOSIS — Z23 Encounter for immunization: Secondary | ICD-10-CM

## 2013-01-24 MED ORDER — GLIPIZIDE 2.5 MG HALF TABLET: 5.0000 mg | ORAL_TABLET | Freq: Two times a day (BID) | ORAL | Status: DC

## 2013-01-24 NOTE — Progress Notes (Signed)
Patient here to get results of recent labs

## 2013-01-24 NOTE — Progress Notes (Signed)
Patient ID: Alyssa Garrison, female   DOB: 04-Aug-1975, 37 y.o.   MRN: [id number]  Patient Demographics  Alyssa Garrison, is a 37 y.o. female  WUJ:[id number]  [id number]  DOB - 1975/11/28  Chief Complaint  Patient presents with  . Results        Subjective:   Alyssa Garrison with History of diabetes mellitus diagnosed recently and was placed on Glucophage few weeks ago is here for routine followup visit without any systemic complaints.  Denies any subjective complaints except as above, no active headache, no chest abdominal pain at this time, not short of breath. No focal weakness which is new.    Objective:    Patient Active Problem List   Diagnosis Date Noted  . DERMATOPHYTOSIS OF FOOT 03/27/2008  . 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 DM 11/15/2007  . EXOGENOUS OBESITY 11/15/2007  . BREAST MASS, LEFT 11/15/2007     Filed Vitals:   01/24/13 1628  BP: 160/104  Pulse: 75  Temp: 97.2 F (36.2 C)  Resp: 18  Weight: 157 lb (71.215 kg)  SpO2: 100%     Exam   Awake Alert, Oriented X 3, No new F.N deficits, nervous affect [place].AT,PERRAL Supple Neck,No JVD, No cervical lymphadenopathy appriciated.  Symmetrical Chest wall movement, Good air movement bilaterally, CTAB RRR,No Gallops,Rubs or new Murmurs, No Parasternal Heave +ve B.Sounds, Abd Soft, Non tender, No organomegaly appriciated, No rebound - guarding or rigidity. No Cyanosis, Clubbing or edema, No new Rash or bruise        Data Review   CBC No results found for this basename: WBC, HGB, HCT, PLT, MCV, MCH, MCHC, RDW, NEUTRABS, LYMPHSABS, MONOABS, EOSABS, BASOSABS, BANDABS, BANDSABD,  in the last 168 hours  Chemistries   No results found for this basename: NA, K, CL, CO2, GLUCOSE, BUN, CREATININE, GFRCGP, CALCIUM, MG, AST, ALT, ALKPHOS, BILITOT,  in the last 168  hours ------------------------------------------------------------------------------------------------------------------ No results found for this basename: HGBA1C,  in the last 72 hours ------------------------------------------------------------------------------------------------------------------ No results found for this basename: CHOL, HDL, LDLCALC, TRIG, CHOLHDL, LDLDIRECT,  in the last 72 hours ------------------------------------------------------------------------------------------------------------------ No results found for this basename: TSH, T4TOTAL, FREET3, T3FREE, THYROIDAB,  in the last 72 hours ------------------------------------------------------------------------------------------------------------------ No results found for this basename: VITAMINB12, FOLATE, FERRITIN, TIBC, IRON, RETICCTPCT,  in the last 72 hours  Coagulation profile  No results found for this basename: INR, PROTIME,  in the last 168 hours     Prior to Admission medications   Medication Sig Start Date End Date Taking? Authorizing Provider  aspirin 81 MG tablet Take 81 mg by mouth daily.      Historical Provider, MD  glipiZIDE (GLUCOTROL) 2.5 mg TABS tablet Take 1 tablet (5 mg total) by mouth 2 (two) times daily before a meal. 01/24/13   Leroy Sea, MD  glucose blood test strip Use as instructed 01/12/13   Richarda Overlie, MD  glucose monitoring kit (FREESTYLE) monitoring kit 1 each by Does not apply route as needed for other. 01/12/13   Richarda Overlie, MD  metFORMIN (GLUCOPHAGE) 1000 MG tablet Take 1 tablet (1,000 mg total) by mouth 2 (two) times daily with a meal. 01/12/13   Richarda Overlie, MD  terbinafine (LAMISIL) 250 MG tablet Take 250 mg by mouth daily.      Historical Provider, MD     Assessment & Plan    Allergies mellitus type II recently diagnosed A1c was greater than 9, she has been placed on Glucophage and her sugars are  running between 250-300, she will be placed on Glucotrol, she has a  pending appointment with an ophthalmologist. She has been requested to do Accu-Cheks q. a.c. at bedtime. The results in a log book and bring it next visit in a week.   Hypertension. Blood pressure noted to be high, patient says that she got nervous, she checks her blood pressure at home and they usually run been 110-120 systolic and diastolic around 80, we will recheck her blood pressure next visit address it at that time.     Routine health maintenance.     Pap smear - referral made    Immunizations Flu shot given, had tetanus shot 4 years ago       Leroy Sea M.D on 01/24/2013 at 4:37 PM

## 2013-01-26 ENCOUNTER — Ambulatory Visit: Payer: No Typology Code available for payment source

## 2013-02-02 ENCOUNTER — Ambulatory Visit: Payer: No Typology Code available for payment source | Attending: Internal Medicine | Admitting: Internal Medicine

## 2013-02-02 VITALS — BP 130/82 | HR 67 | Temp 97.8°F | Resp 16

## 2013-02-02 DIAGNOSIS — R030 Elevated blood-pressure reading, without diagnosis of hypertension: Secondary | ICD-10-CM | POA: Insufficient documentation

## 2013-02-02 DIAGNOSIS — E119 Type 2 diabetes mellitus without complications: Secondary | ICD-10-CM | POA: Insufficient documentation

## 2013-02-02 MED ORDER — METFORMIN HCL 1000 MG PO TABS: 1000.0000 mg | ORAL_TABLET | Freq: Two times a day (BID) | ORAL | Status: DC

## 2013-02-02 MED ORDER — GLIPIZIDE 5 MG PO TABS: 5.0000 mg | ORAL_TABLET | Freq: Two times a day (BID) | ORAL | Status: DC

## 2013-02-02 NOTE — Progress Notes (Signed)
Patient Demographics  Alyssa Garrison, is a 37 y.o. female  [id number]  [id number]  DOB - [birth year]  Chief Complaint  Patient presents with  . Follow-up        Subjective:   Alyssa Garrison today is here for a follow up visit. She was recently seen in the clinic where she had an elevated blood pressure reading, she was told to followup. Today her blood pressure is controlled without the need for any medications. She has brought with her her CBG readings for the past week, most of them are in the high to low 200s range. She has no complaints at this time.   Patient has No headache, No chest pain, No abdominal pain - No Nausea, No new weakness tingling or numbness, No Cough - SOB.   Objective:    Filed Vitals:   02/02/13 1641  BP: 130/82  Pulse: 67  Temp: 97.8 F (36.6 C)  Resp: 16     ALLERGIES:  No Known Allergies  PAST MEDICAL HISTORY: History reviewed. No pertinent past medical history.  MEDICATIONS AT HOME: Prior to Admission medications   Medication Sig Start Date End Date Taking? Authorizing Provider  aspirin 81 MG tablet Take 81 mg by mouth daily.      Historical Provider, MD  glipiZIDE (GLUCOTROL) 5 MG tablet Take 1 tablet (5 mg total) by mouth 2 (two) times daily before a meal. 02/02/13   Maretta Bees, MD  glucose blood test strip Use as instructed 01/12/13   Richarda Overlie, MD  glucose monitoring kit (FREESTYLE) monitoring kit 1 each by Does not apply route as needed for other. 01/12/13   Richarda Overlie, MD  metFORMIN (GLUCOPHAGE) 1000 MG tablet Take 1 tablet (1,000 mg total) by mouth 2 (two) times daily with a meal. 02/02/13   Shanker Levora Dredge, MD  terbinafine (LAMISIL) 250 MG tablet Take 250 mg by mouth daily.      Historical Provider, MD     Exam  General appearance :Awake, alert, not in any distress. Speech Clear. Not toxic Looking HEENT: Atraumatic and Normocephalic, pupils equally reactive to light and accomodation Neck:  supple, no JVD. No cervical lymphadenopathy.  Chest:Good air entry bilaterally, no added sounds  CVS: S1 S2 regular, no murmurs.  Abdomen: Bowel sounds present, Non tender and not distended with no gaurding, rigidity or rebound. Extremities: B/L Lower Ext shows no edema, both legs are warm to touch Neurology: Awake alert, and oriented X 3, CN II-XII intact, Non focal Skin:No Rash Wounds:N/A    Data Review   CBC No results found for this basename: WBC, HGB, HCT, PLT, MCV, MCH, MCHC, RDW, NEUTRABS, LYMPHSABS, MONOABS, EOSABS, BASOSABS, BANDABS, BANDSABD,  in the last 168 hours  Chemistries   No results found for this basename: NA, K, CL, CO2, GLUCOSE, BUN, CREATININE, GFRCGP, CALCIUM, MG, AST, ALT, ALKPHOS, BILITOT,  in the last 168 hours ------------------------------------------------------------------------------------------------------------------ No results found for this basename: HGBA1C,  in the last 72 hours ------------------------------------------------------------------------------------------------------------------ No results found for this basename: CHOL, HDL, LDLCALC, TRIG, CHOLHDL, LDLDIRECT,  in the last 72 hours ------------------------------------------------------------------------------------------------------------------ No results found for this basename: TSH, T4TOTAL, FREET3, T3FREE, THYROIDAB,  in the last 72 hours ------------------------------------------------------------------------------------------------------------------ No results found for this basename: VITAMINB12, FOLATE, FERRITIN, TIBC, IRON, RETICCTPCT,  in the last 72 hours  Coagulation profile  No results found for this basename: INR, PROTIME,  in the last 168 hours    Assessment & Plan   Diabetes - Continue with metformin 1000 mg twice  daily - Increase glipizide to 5 mg twice a day - Followup in 2 months, we'll recheck A1c during that visit- A1c on 8/27-9.6.  Elevated blood pressure  reading without diagnosis of hypertension - BP stable today, recheck at next visit.  Health Maintenance -Colonoscopy: Not needed -Pap Smear: From May the last visit by Dr. Thedore Mins -Mammogram: Not needed -Vaccinations:  -Influenza given last visit  Follow up in 2 months   The patient was given clear instructions to go to ER or return to medical center if symptoms don't improve, worsen or new problems develop. The patient verbalized understanding. The patient was told to call to get lab results if they haven't heard anything in the next week.

## 2013-02-02 NOTE — Progress Notes (Signed)
Patient here for follow up DM Feels medications are not controlling her sugar

## 2013-02-03 ENCOUNTER — Ambulatory Visit: Payer: No Typology Code available for payment source | Admitting: Internal Medicine

## 2013-03-28 ENCOUNTER — Encounter: Payer: No Typology Code available for payment source | Admitting: Obstetrics & Gynecology

## 2013-04-11 ENCOUNTER — Ambulatory Visit: Payer: No Typology Code available for payment source | Admitting: Internal Medicine

## 2013-04-28 ENCOUNTER — Encounter: Payer: Self-pay | Admitting: Internal Medicine

## 2013-04-28 ENCOUNTER — Ambulatory Visit: Payer: No Typology Code available for payment source | Attending: Internal Medicine | Admitting: Internal Medicine

## 2013-04-28 VITALS — BP 149/78 | HR 79 | Temp 98.9°F | Resp 15

## 2013-04-28 DIAGNOSIS — E1129 Type 2 diabetes mellitus with other diabetic kidney complication: Secondary | ICD-10-CM | POA: Insufficient documentation

## 2013-04-28 DIAGNOSIS — E119 Type 2 diabetes mellitus without complications: Secondary | ICD-10-CM

## 2013-04-28 MED ORDER — GLIPIZIDE 5 MG PO TABS: 5.0000 mg | ORAL_TABLET | Freq: Two times a day (BID) | ORAL | Status: DC

## 2013-04-28 MED ORDER — ASPIRIN 81 MG PO TABS: 81.0000 mg | ORAL_TABLET | Freq: Every day | ORAL | Status: AC

## 2013-04-28 MED ORDER — METFORMIN HCL 1000 MG PO TABS: 1000.0000 mg | ORAL_TABLET | Freq: Two times a day (BID) | ORAL | Status: DC

## 2013-04-28 NOTE — Progress Notes (Signed)
Pt is here for a f/u for diabetes. 

## 2013-04-28 NOTE — Patient Instructions (Addendum)
Diabetes y Doroteo Glassman fsica (Diabetes and Exercise) Hacer actividad fsica con regularidad es muy importante. No se trata slo de perder peso. Tiene muchos otros beneficios, como por ejemplo:  Mejorar el [place], flexibilidad y resistencia.  Aumenta la densidad sea.  Ayuda a Art gallery manager.  Disminuye la Art gallery manager.  Aumenta la fuerza muscular.  Reduce el estrs y las tensiones.  Mejora el estado de salud general. Las personas diabticas que realizan actividad fsica tienen beneficios adicionales debido al ejercicio:  Reduce el apetito.  Mejora la utilizacin del azcar (glucosa) por parte del organismo.  Ayuda a disminuir o Information systems manager.  Disminuye la presin arterial.  Ayuda a disminuir los lpidos en sangre (colesterol y triglicridos).  Mejora el uso de la insulina por parte del organismo porque:  Aumenta la sensibilidad del organismo a la insulina.  Reduce las necesidades de insulina del organismo.  Disminuye el riesgo de enfermedad cardaca por la actividad fsica.  Disminuye el colesterol y los triglicridos.  Aumenta los niveles de colesterol bueno (como las lipoprotenas de alta densidad [HDL]) en el organismo.  Disminuye los niveles de glucosa en Nada. SU PLAN DE ACTIVIDAD  Elija una actividad que disfrute y establezca objetivos realistas. Su mdico o educador en diabetes podrn ayudarlo a Tax adviser que lo beneficie. Podr dividir las actividades en 2 o 3 sesiones a lo Paediatric nurse. Hacer esto es tan bueno como hacer una sesin prolongada. Las ideas para los ejercicios incluyen:  Lleve a Multimedia programmer.  Utilice las Microbiologist del ascensor.  Baile su cancin favorita.  Haga sus ejercicios favoritos con Leisure centre manager. RECOMENDACIONES PARA REALIZAR EJERCICIOS CUANDO SE TIENE DIABETES TIPO 1 O TIPO 2   Controle la glucosa en sangre antes de comenzar. Si el nivel de glucosa en sangre es de ms de 240  mg/dl, controle las cetonas en la [place]. Si hay cetonas, no realice actividad fsica.  Evite inyectarse insulina en las zonas del cuerpo que ejercitar. Por ejemplo, evite inyectarse insulina en:  Los brazos, si juega al tenis.  Las piernas, si corre.  Lleve un registro de:  Alimentos que consume antes y despus de Tour manager.  Los momentos esperables de picos de accin de la insulina.  Los niveles de glucosa en sangre antes y despus de hacer ejercicios.  El tipo y cantidad de [place].  Revise los registros con su mdico. El mdico lo ayudar a Environmental education officer pautas para ajustar la cantidad de alimento y las cantidades de insulina antes y despus de Radio producer ejercicios.  Si toma insulina o agentes hipoglucemiantes por va oral, observe si hay signos y sntomas de hipoglucemia. Ellos son:  Mareos.  Temblores.  Sudoracin.  Escalofros.  Confusin.  Beba gran cantidad de agua mientras hace ejercicios para evitar la deshidratacin o el infarto. Durante la actividad fsica se pierde Data processing manager.  Comente con su mdico antes de comenzar un programa de actividad fsica para verificar que sea seguro para usted. Recuerde, cualquier actividad es mejor que ninguna. Document Released: 05/25/2007 Document Revised: 01/05/2013 [facility] Patient Information 2014 [place], [place]. Ejercicios para perder peso (Exercise to Lose Weight) La actividad fsica y [place]omia Dear dieta saludable ayudan a perder peso. El mdico podr sugerirle ejercicios especficos. IDEAS Y CONSEJOS PARA HACER EJERCICIOS  Elija opciones econmicas que disfrute hacer , como caminar, andar en bicicleta o los vdeos para ejercitarse.  Utilice las Microbiologist del ascensor.  Camine durante la hora del almuerzo.  Estacione el auto lejos del lugar de [place] o [place].  Concurra a un gimnasio o tome clases de gimnasia.  Comience con 5  10 minutos de actividad fsica por da. Ejercite hasta 30 minutos, 4 a  6 das por [address]  Utilice zapatos que tengan un buen soporte y ropas cmodas.  Elongue antes y despus de Company secretary.  Ejercite hasta que aumente la respiracin y el corazn palpite rpido.  Beba agua extra cuando ejercite.  No haga ejercicio Firefighter, sentirse mareado o que le falte mucho el aire. La actividad fsica puede quemar alrededor de 150 caloras.  Correr 20 cuadras en 15 minutos.  Jugar vley durante 45 a 60 minutos.  Limpiar y encerar el auto durante 45 a 60 minutos.  Jugar ftbol americano de toque.  Caminar 25 cuadras en 35 minutos.  Empujar un cochecito 20 cuadras en 30 minutos.  Jugar baloncesto durante 30 minutos.  Rastrillar hojas secas durante 30 minutos.  Andar en bicicleta 80 cuadras en 30 minutos.  Caminar 30 cuadras en 30 minutos.  Bailar durante 30 minutos.  Quitar la nieve con una pala durante 15 minutos.  Nadar vigorosamente durante 20 minutos.  Subir escaleras durante 15 minutos.  Andar en bicicleta 60 cuadras durante 15 minutos.  Arreglar el jardn entre 30 y 45 minutos.  Saltar a la soga durante 15 minutos.  Limpiar vidrios o pisos durante 45 a 60 minutos. Document Released: 08/09/2010 Document Revised: 07/28/2011 [facility] Patient Information 2014 [place], [place]. DASH Diet The DASH diet stands for "Dietary Approaches to Stop Hypertension." It is a healthy eating plan that has been shown to reduce high blood pressure (hypertension) in as little as 14 days, while also possibly providing other significant health benefits. These other health benefits include reducing the risk of breast cancer after menopause and reducing the risk of type 2 diabetes, heart disease, colon cancer, and stroke. Health benefits also include weight loss and slowing kidney failure in patients with chronic kidney disease.  DIET GUIDELINES  Limit salt (sodium). Your diet should contain less than 1500 mg of sodium daily.  Limit refined or processed  carbohydrates. Your diet should include mostly whole grains. Desserts and added sugars should be used sparingly.  Include small amounts of heart-healthy fats. These types of fats include nuts, oils, and tub margarine. Limit saturated and trans fats. These fats have been shown to be harmful in the body. CHOOSING FOODS  The following food groups are based on a 2000 calorie diet. See your Registered Dietitian for individual calorie needs. Grains and Grain Products (6 to 8 servings daily)  Eat More Often: Whole-wheat bread, brown rice, whole-grain or wheat pasta, quinoa, popcorn without added fat or salt (air popped).  Eat Less Often: White bread, white pasta, white rice, cornbread. Vegetables (4 to 5 servings daily)  Eat More Often: Fresh, frozen, and canned vegetables. Vegetables may be raw, steamed, roasted, or grilled with a minimal amount of fat.  Eat Less Often/Avoid: Creamed or fried vegetables. Vegetables in a cheese sauce. Fruit (4 to 5 servings daily)  Eat More Often: All fresh, canned (in natural juice), or frozen fruits. Dried fruits without added sugar. One hundred percent fruit juice ( cup [237 mL] daily).  Eat Less Often: Dried fruits with added sugar. Canned fruit in light or heavy syrup. [employer], Fish, and Poultry (2 servings or less daily. One serving is 3 to 4 oz [85-114 g]).  Eat More Often: Ninety percent or leaner ground beef, tenderloin, sirloin. Round  cuts of beef, chicken breast, [place] breast. All fish. Grill, bake, or broil your meat. Nothing should be fried.  Eat Less Often/Avoid: Fatty cuts of meat, [place], or chicken leg, thigh, or wing. Fried cuts of meat or fish. Dairy (2 to 3 servings)  Eat More Often: Low-fat or fat-free milk, low-fat plain or light yogurt, reduced-fat or part-skim cheese.  Eat Less Often/Avoid: Milk (whole, 2%).Whole milk yogurt. Full-fat cheeses. Nuts, Seeds, and Legumes (4 to 5 servings per week)  Eat More Often: All without added  salt.  Eat Less Often/Avoid: Salted nuts and seeds, canned beans with added salt. Fats and Sweets (limited)  Eat More Often: Vegetable oils, tub margarines without trans fats, sugar-free gelatin. Mayonnaise and salad dressings.  Eat Less Often/Avoid: Coconut oils, palm oils, butter, stick margarine, cream, half and half, cookies, candy, pie. FOR MORE INFORMATION The Dash Diet Eating Plan: www.dashdiet.org Document Released: 04/24/2011 Document Revised: 07/28/2011 Document Reviewed: 04/24/2011 [facility] Patient Information 2014 [place], [place].

## 2013-04-28 NOTE — Progress Notes (Signed)
Patient ID: Alyssa Garrison, female   DOB: 01/04/1976, 37 y.o.   MRN: [id number] Patient Demographics  Alyssa Garrison, is a 37 y.o. female  [id number]  [id number]  DOB - 10/16/1975  Chief Complaint  Patient presents with  . Follow-up        Subjective:   Alyssa Garrison is a 37 y.o. female here today for a follow up visit. Patient is here for her routine followup. No new complaints. Her last hemoglobin A1c was 9.6%. Patient is compliant with medications, on metformin and glipizide. Patient is not known to have hypertension. Patient does not smoke cigarette, she does not drink alcohol. Patient has No headache, No chest pain, No abdominal pain - No Nausea, No new weakness tingling or numbness, No Cough - SOB.  ALLERGIES: No Known Allergies  PAST MEDICAL HISTORY: No past medical history on file.  MEDICATIONS AT HOME: Prior to Admission medications   Medication Sig Start Date End Date Taking? Authorizing Provider  glipiZIDE (GLUCOTROL) 5 MG tablet Take 1 tablet (5 mg total) by mouth 2 (two) times daily before a meal. 04/28/13  Yes Jeanann Lewandowsky, MD  glucose blood test strip Use as instructed 01/12/13  Yes Richarda Overlie, MD  glucose monitoring kit (FREESTYLE) monitoring kit 1 each by Does not apply route as needed for other. 01/12/13  Yes Richarda Overlie, MD  metFORMIN (GLUCOPHAGE) 1000 MG tablet Take 1 tablet (1,000 mg total) by mouth 2 (two) times daily with a meal. 04/28/13  Yes Jeanann Lewandowsky, MD  aspirin 81 MG tablet Take 1 tablet (81 mg total) by mouth daily. 04/28/13   Jeanann Lewandowsky, MD  terbinafine (LAMISIL) 250 MG tablet Take 250 mg by mouth daily.      Historical Provider, MD     Objective:   Filed Vitals:   04/28/13 1737  BP: 149/78  Pulse: 79  Temp: 98.9 F (37.2 C)  TempSrc: Oral  Resp: 15  SpO2: 100%    Exam General appearance : Awake, alert, not in any distress. Speech Clear. Not toxic looking HEENT: Atraumatic and  Normocephalic, pupils equally reactive to light and accomodation Neck: supple, no JVD. No cervical lymphadenopathy.  Chest:Good air entry bilaterally, no added sounds  CVS: S1 S2 regular, no murmurs.  Abdomen: Bowel sounds present, Non tender and not distended with no gaurding, rigidity or rebound. Extremities: B/L Lower Ext shows no edema, both legs are warm to touch Neurology: Awake alert, and oriented X 3, CN II-XII intact, Non focal Skin:No Rash Wounds:N/A   Data Review   CBC No results found for this basename: WBC, HGB, HCT, PLT, MCV, MCH, MCHC, RDW, NEUTRABS, LYMPHSABS, MONOABS, EOSABS, BASOSABS, BANDABS, BANDSABD,  in the last 168 hours  Chemistries   No results found for this basename: NA, K, CL, CO2, GLUCOSE, BUN, CREATININE, GFRCGP, CALCIUM, MG, AST, ALT, ALKPHOS, BILITOT,  in the last 168 hours ------------------------------------------------------------------------------------------------------------------  Recent Labs  04/28/13 1754  HGBA1C 7.9   ------------------------------------------------------------------------------------------------------------------ No results found for this basename: CHOL, HDL, LDLCALC, TRIG, CHOLHDL, LDLDIRECT,  in the last 72 hours ------------------------------------------------------------------------------------------------------------------ No results found for this basename: TSH, T4TOTAL, FREET3, T3FREE, THYROIDAB,  in the last 72 hours ------------------------------------------------------------------------------------------------------------------ No results found for this basename: VITAMINB12, FOLATE, FERRITIN, TIBC, IRON, RETICCTPCT,  in the last 72 hours  Coagulation profile  No results found for this basename: INR, PROTIME,  in the last 168 hours    Assessment & Plan   1. Diabetes  - HgB A1c 7.9% today down from 9.6%  2. DM2 (  diabetes mellitus, type 2)  - metFORMIN (GLUCOPHAGE) 1000 MG tablet; Take 1 tablet (1,000 mg  total) by mouth 2 (two) times daily with a meal.  Dispense: 180 tablet; Refill: 3 - glipiZIDE (GLUCOTROL) 5 MG tablet; Take 1 tablet (5 mg total) by mouth 2 (two) times daily before a meal.  Dispense: 180 tablet; Refill: 3 - aspirin 81 MG tablet; Take 1 tablet (81 mg total) by mouth daily.  Dispense: 90 tablet; Refill: 3  Follow up in 4 weeks or when necessary for blood pressure check  Interpreter was used to communicate directly with patient for the entire encounter including providing detailed patient instructions.   The patient was given clear instructions to go to ER or return to medical center if symptoms don't improve, worsen or new problems develop. The patient verbalized understanding. The patient was told to call to get lab results if they haven't heard anything in the next week.    Jeanann Lewandowsky, MD, MHA, FACP, FAAP [facility] [address], [place] [phone]   04/28/2013, 6:02 PM

## 2013-05-27 ENCOUNTER — Ambulatory Visit: Payer: No Typology Code available for payment source | Attending: Internal Medicine | Admitting: Internal Medicine

## 2013-06-02 ENCOUNTER — Other Ambulatory Visit (HOSPITAL_COMMUNITY)
Admission: RE | Admit: 2013-06-02 | Discharge: 2013-06-02 | Disposition: A | Discharge status: Home / Self Care | Payer: No Typology Code available for payment source | Source: Ambulatory Visit | Attending: Internal Medicine | Admitting: Internal Medicine

## 2013-06-02 ENCOUNTER — Encounter: Payer: Self-pay | Admitting: Internal Medicine

## 2013-06-02 ENCOUNTER — Ambulatory Visit: Payer: No Typology Code available for payment source | Attending: Internal Medicine | Admitting: Internal Medicine

## 2013-06-02 VITALS — BP 140/86 | HR 72 | Temp 99.1°F | Resp 16 | Ht <= 58 in | Wt 160.0 lb

## 2013-06-02 DIAGNOSIS — Z113 Encounter for screening for infections with a predominantly sexual mode of transmission: Secondary | ICD-10-CM | POA: Insufficient documentation

## 2013-06-02 DIAGNOSIS — Z124 Encounter for screening for malignant neoplasm of cervix: Secondary | ICD-10-CM

## 2013-06-02 DIAGNOSIS — E119 Type 2 diabetes mellitus without complications: Secondary | ICD-10-CM

## 2013-06-02 DIAGNOSIS — N760 Acute vaginitis: Secondary | ICD-10-CM | POA: Insufficient documentation

## 2013-06-02 DIAGNOSIS — Z1151 Encounter for screening for human papillomavirus (HPV): Secondary | ICD-10-CM | POA: Insufficient documentation

## 2013-06-02 DIAGNOSIS — Z01419 Encounter for gynecological examination (general) (routine) without abnormal findings: Secondary | ICD-10-CM | POA: Insufficient documentation

## 2013-06-02 LAB — GLUCOSE, POCT (MANUAL RESULT ENTRY): POC GLUCOSE: 137 mg/dL — AB (ref 70–99)

## 2013-06-02 MED ORDER — GLUCOSE BLOOD VI STRP: ORAL_STRIP | Status: DC

## 2013-06-02 MED ORDER — FREESTYLE SYSTEM KIT: 1.0000 | PACK | Status: DC | PRN

## 2013-06-02 MED ORDER — FREESTYLE LANCETS MISC: Status: DC

## 2013-06-02 NOTE — Progress Notes (Signed)
Pt is here today requesting a pap smear.

## 2013-06-02 NOTE — Progress Notes (Signed)
Patient ID: Alyssa Garrison, female   DOB: Apr 30, 1976, 38 y.o.   MRN: [id number] Patient Demographics  Alyssa Garrison, is a 38 y.o. female  [id number]  [id number]  DOB - 20-Feb-1976  No chief complaint on file.       Subjective:   Alyssa Garrison is a 38 y.o. female here today for a follow up visit. Patient is here for Pap smear. She is known to have diabetes without complications, she claims compliance with medication, blood sugar has been controlled. Not known to have hypertension but blood pressure is usually around high normal level. She has no blurry vision, no headache.  Patient has No headache, No chest pain, No abdominal pain - No Nausea, No new weakness tingling or numbness, No Cough - SOB.  ALLERGIES: No Known Allergies  PAST MEDICAL HISTORY: Past Medical History  Diagnosis Date  . Diabetes mellitus without complication     MEDICATIONS AT HOME: Prior to Admission medications   Medication Sig Start Date End Date Taking? Authorizing Provider  aspirin 81 MG tablet Take 1 tablet (81 mg total) by mouth daily. 04/28/13  Yes Angelica Chessman, MD  glipiZIDE (GLUCOTROL) 5 MG tablet Take 1 tablet (5 mg total) by mouth 2 (two) times daily before a meal. 04/28/13  Yes Angelica Chessman, MD  glucose blood test strip Use as instructed 01/12/13  Yes Reyne Dumas, MD  glucose monitoring kit (FREESTYLE) monitoring kit 1 each by Does not apply route as needed for other. 01/12/13  Yes Reyne Dumas, MD  metFORMIN (GLUCOPHAGE) 1000 MG tablet Take 1 tablet (1,000 mg total) by mouth 2 (two) times daily with a meal. 04/28/13  Yes Angelica Chessman, MD  terbinafine (LAMISIL) 250 MG tablet Take 250 mg by mouth daily.      Historical Provider, MD     Objective:   Filed Vitals:   06/02/13 1634  BP: 140/86  Pulse: 72  Temp: 99.1 F (37.3 C)  TempSrc: Oral  Resp: 16  Height: $Remove'4\' 8"'[id number]$  (1.422 m)  Weight: 160 lb (72.576 kg)  SpO2: 98%    Exam General appearance :  Awake, alert, not in any distress. Speech Clear. Not toxic looking, obese HEENT: Atraumatic and Normocephalic, pupils equally reactive to light and accomodation Neck: supple, no JVD. No cervical lymphadenopathy.  Chest:Good air entry bilaterally, no added sounds  CVS: S1 S2 regular, no murmurs.  Abdomen: Bowel sounds present, Non tender and not distended with no gaurding, rigidity or rebound. Extremities: B/L Lower Ext shows no edema, both legs are warm to touch Neurology: Awake alert, and oriented X 3, CN II-XII intact, Non focal Skin:No Rash Wounds:N/A Pelvic examination: Normal female external genitalia, central cervix, negative cervical excitation tenderness, minimal discharge in the introitus, no contact bleeding  Data Review   CBC No results found for this basename: WBC, HGB, HCT, PLT, MCV, MCH, MCHC, RDW, NEUTRABS, LYMPHSABS, MONOABS, EOSABS, BASOSABS, BANDABS, BANDSABD,  in the last 168 hours  Chemistries   No results found for this basename: NA, K, CL, CO2, GLUCOSE, BUN, CREATININE, GFRCGP, CALCIUM, MG, AST, ALT, ALKPHOS, BILITOT,  in the last 168 hours ------------------------------------------------------------------------------------------------------------------ No results found for this basename: HGBA1C,  in the last 72 hours ------------------------------------------------------------------------------------------------------------------ No results found for this basename: CHOL, HDL, LDLCALC, TRIG, CHOLHDL, LDLDIRECT,  in the last 72 hours ------------------------------------------------------------------------------------------------------------------ No results found for this basename: TSH, T4TOTAL, FREET3, T3FREE, THYROIDAB,  in the last 72 hours ------------------------------------------------------------------------------------------------------------------ No results found for this basename: VITAMINB12, FOLATE, FERRITIN, TIBC, IRON, RETICCTPCT,  in the last  72  hours  Coagulation profile  No results found for this basename: INR, PROTIME,  in the last 168 hours    Assessment & Plan   1. Diabetes  - Glucose (CBG) acceptable Continue present regimen Patient counseled extensively about nutrition and exercise  2. Pap smear for cervical cancer screening Pap smear done and sent for cytology - Cytology - PAP - Cervicovaginal ancillary only    Follow up in 3 months or when necessary   The patient was given clear instructions to go to ER or return to medical center if symptoms don't improve, worsen or new problems develop. The patient verbalized understanding. The patient was told to call to get lab results if they haven't heard anything in the next week.    Angelica Chessman, MD, [place], [place], [place] and [place], [place]   06/02/2013, 5:43 PM

## 2013-06-07 ENCOUNTER — Telehealth: Payer: Self-pay | Admitting: *Deleted

## 2013-06-07 NOTE — Telephone Encounter (Signed)
Used interpretor line. Left message with family member.

## 2013-06-07 NOTE — Telephone Encounter (Signed)
Message copied by Joan Mayans on Tue Jun 07, 2013  5:43 PM ------      Message from: Tresa Garter      Created: Tue Jun 07, 2013  4:44 PM       Please inform patient that her Pap smear result is normal ------

## 2013-07-29 ENCOUNTER — Ambulatory Visit: Payer: Self-pay | Admitting: Internal Medicine

## 2013-08-17 ENCOUNTER — Ambulatory Visit: Payer: No Typology Code available for payment source | Attending: Internal Medicine

## 2013-08-22 ENCOUNTER — Ambulatory Visit: Payer: No Typology Code available for payment source | Attending: Internal Medicine

## 2013-08-22 ENCOUNTER — Ambulatory Visit: Payer: No Typology Code available for payment source | Attending: Internal Medicine | Admitting: Internal Medicine

## 2013-08-22 ENCOUNTER — Encounter: Payer: Self-pay | Admitting: Internal Medicine

## 2013-08-22 VITALS — BP 152/86 | HR 87 | Temp 97.8°F | Resp 16

## 2013-08-22 DIAGNOSIS — I10 Essential (primary) hypertension: Secondary | ICD-10-CM | POA: Insufficient documentation

## 2013-08-22 DIAGNOSIS — Z09 Encounter for follow-up examination after completed treatment for conditions other than malignant neoplasm: Secondary | ICD-10-CM | POA: Insufficient documentation

## 2013-08-22 DIAGNOSIS — IMO0001 Reserved for inherently not codable concepts without codable children: Secondary | ICD-10-CM | POA: Insufficient documentation

## 2013-08-22 DIAGNOSIS — K029 Dental caries, unspecified: Secondary | ICD-10-CM | POA: Insufficient documentation

## 2013-08-22 DIAGNOSIS — E1165 Type 2 diabetes mellitus with hyperglycemia: Secondary | ICD-10-CM

## 2013-08-22 DIAGNOSIS — E119 Type 2 diabetes mellitus without complications: Secondary | ICD-10-CM

## 2013-08-22 HISTORY — DX: Essential (primary) hypertension: I10

## 2013-08-22 LAB — GLUCOSE, POCT (MANUAL RESULT ENTRY): POC Glucose: 226 mg/dl — AB (ref 70–99)

## 2013-08-22 LAB — POCT GLYCOSYLATED HEMOGLOBIN (HGB A1C): Hemoglobin A1C: 9.6

## 2013-08-22 MED ORDER — GLIPIZIDE 10 MG PO TABS
10.0000 mg | ORAL_TABLET | Freq: Two times a day (BID) | ORAL | Status: DC
Start: 2013-08-22 — End: 2013-11-21

## 2013-08-22 MED ORDER — METFORMIN HCL 1000 MG PO TABS: 1000.0000 mg | ORAL_TABLET | Freq: Two times a day (BID) | ORAL | Status: DC

## 2013-08-22 MED ORDER — RAMIPRIL 2.5 MG PO CAPS: 2.5000 mg | ORAL_CAPSULE | Freq: Every day | ORAL | Status: DC

## 2013-08-22 MED ORDER — GLIPIZIDE 5 MG PO TABS: 5.0000 mg | ORAL_TABLET | Freq: Two times a day (BID) | ORAL | Status: DC

## 2013-08-22 NOTE — Progress Notes (Signed)
Patient here for follow up DM 

## 2013-08-22 NOTE — Progress Notes (Signed)
MRN: [id number] Name: Alyssa Garrison  Sex: female Age: 38 y.o. DOB: 05-Mar-1976  Allergies: Review of patient's allergies indicates no known allergies.  Chief Complaint  Patient presents with  . Follow-up    HPI: Patient is 38 y.o. female who has to of diabetes hypertension, comes today for followup, she denies any hypoglycemic symptoms, as per patient she has been taking her medications regularly. Today her blood pressure trend noticed to be high, denies any headache dizziness chest and shortness of breath. Patient reported to have lot of dental cavities and is requesting a referral to see a dentist.  Past Medical History  Diagnosis Date  . Diabetes mellitus without complication     History reviewed. No pertinent past surgical history.    Medication List       This list is accurate as of: 08/22/13  3:44 PM.  Always use your most recent med list.               aspirin 81 MG tablet  Take 1 tablet (81 mg total) by mouth daily.     freestyle lancets  Use as instructed     glipiZIDE 10 MG tablet  Commonly known as:  GLUCOTROL  Take 1 tablet (10 mg total) by mouth 2 (two) times daily before a meal.     glucose blood test strip  Use as instructed     glucose monitoring kit monitoring kit  1 each by Does not apply route as needed for other.     metFORMIN 1000 MG tablet  Commonly known as:  GLUCOPHAGE  Take 1 tablet (1,000 mg total) by mouth 2 (two) times daily with a meal.     ramipril 2.5 MG capsule  Commonly known as:  ALTACE  Take 1 capsule (2.5 mg total) by mouth daily.     terbinafine 250 MG tablet  Commonly known as:  LAMISIL  Take 250 mg by mouth daily.        Meds ordered this encounter  Medications  . DISCONTD: glipiZIDE (GLUCOTROL) 5 MG tablet    Sig: Take 1 tablet (5 mg total) by mouth 2 (two) times daily before a meal.    Dispense:  180 tablet    Refill:  3  . metFORMIN (GLUCOPHAGE) 1000 MG tablet    Sig: Take 1 tablet (1,000 mg  total) by mouth 2 (two) times daily with a meal.    Dispense:  180 tablet    Refill:  3  . glipiZIDE (GLUCOTROL) 10 MG tablet    Sig: Take 1 tablet (10 mg total) by mouth 2 (two) times daily before a meal.    Dispense:  180 tablet    Refill:  3  . ramipril (ALTACE) 2.5 MG capsule    Sig: Take 1 capsule (2.5 mg total) by mouth daily.    Dispense:  90 capsule    Refill:  3    Immunization History  Administered Date(s) Administered  . Influenza Split 01/24/2013  . Influenza Whole 03/14/2008    Family History  Problem Relation Age of Onset  . Diabetes Mother   . Diabetes Paternal Grandmother     History  Substance Use Topics  . Smoking status: Never Smoker   . Smokeless tobacco: Not on file  . Alcohol Use: Not on file    Review of Systems   As noted in HPI  Filed Vitals:   08/22/13 1527  BP: 152/86  Pulse: 87  Temp: 97.8 F (36.6 C)  Resp: 16    Physical Exam  Physical Exam  Constitutional: No distress.  HENT:  Dental cavities   Cardiovascular: Normal rate and regular rhythm.   Pulmonary/Chest: Breath sounds normal. No respiratory distress. She has no wheezes. She has no rales.  Musculoskeletal: She exhibits no edema.    CBC    Component Value Date/Time   WBC 8.9 01/12/2013 1807   RBC 4.57 01/12/2013 1807   HGB 12.1 01/12/2013 1807   HCT 36.0 01/12/2013 1807   PLT 319 01/12/2013 1807   MCV 78.8 01/12/2013 1807   LYMPHSABS 2.8 01/12/2013 1807   MONOABS 0.6 01/12/2013 1807   EOSABS 0.1 01/12/2013 1807   BASOSABS 0.0 01/12/2013 1807    CMP     Component Value Date/Time   NA 137 01/12/2013 1807   K 4.2 01/12/2013 1807   CL 102 01/12/2013 1807   CO2 24 01/12/2013 1807   GLUCOSE 218* 01/12/2013 1807   BUN 9 01/12/2013 1807   CREATININE 0.60 01/12/2013 1807   CREATININE 0.51 03/08/2009 1005   CALCIUM 9.4 01/12/2013 1807   PROT 8.1 01/12/2013 1807   ALBUMIN 4.6 01/12/2013 1807   AST 15 01/12/2013 1807   ALT 11 01/12/2013 1807   ALKPHOS 62 01/12/2013 1807    BILITOT 0.4 01/12/2013 1807   GFRNONAA >60 03/08/2009 1005   GFRAA  Value: >60        The eGFR has been calculated using the MDRD equation. This calculation has not been validated in all clinical situations. eGFR's persistently <60 mL/min signify possible Chronic Kidney Disease. 03/08/2009 1005    Lab Results  Component Value Date/Time   CHOL 169 01/12/2013  6:07 PM    No components found with this basename: hga1c    Lab Results  Component Value Date/Time   AST 15 01/12/2013  6:07 PM    Assessment and Plan  DM2 (diabetes mellitus, type 2) -  Results for orders placed in visit on 08/22/13  GLUCOSE, POCT (MANUAL RESULT ENTRY)      Result Value Ref Range   POC Glucose 226 (*) 70 - 99 mg/dl  POCT GLYCOSYLATED HEMOGLOBIN (HGB A1C)      Result Value Ref Range   Hemoglobin A1C 9.6     Diabetes is uncontrolled, she will continue her with metformin 1 g twice a day, I have increased the dose of Glucotrol to 10 mg twice a day, advise patient to keep the fingerstick log, will recheck hemoglobin A1c on next visit.  Plan: metFORMIN (GLUCOPHAGE) 1000 MG tablet, Glucose (CBG), HgB A1c, glipiZIDE (GLUCOTROL) 10 MG tablet, DISCONTINUED: glipiZIDE (GLUCOTROL) 5 MG tablet  Essential hypertension, benign - Plan: Advised patient for DASH diet, started on ramipril (ALTACE) 2.5 MG capsule  Dental cavities - Plan: Ambulatory referral to Dentistry   Return in about 3 months (around 11/21/2013) for diabetes, hypertension.  Lorayne Marek, MD

## 2013-08-22 NOTE — Patient Instructions (Signed)
Gua de planeamiento de la alimentacin para diabticos (Diabetes Meal Planning Guide) La gua de planeamiento de alimentacin para diabticos es una herramienta para ayudarlo a planear sus comidas y colaciones. Es importante para las personas con diabetes controlar sus niveles de Location manager. Elegir los [employer] correctos y las cantidades adecuadas durante el da le ayudar a Media planner. [place] bien puede incluso ayudarlo a mejorar la presin sangunea y Science writer o Theatre manager un peso saludable. CUENTE LOS HIDRATOS DE CARBONO CON FACILIDAD Cuando consume hidratos de carbono, stos se transforman en azcar (glucosa). Esto a su vez Agricultural consultant de Museum/gallery exhibitions officer. El conteo de carbohidratos puede ayudarlo a Chief Technology Officer este nivel para que se sienta mejor. Al planear sus alimentos con el conteo de carbohidratos, podr tener ms flexibilidad en lo que come y Curator con el consumo de alimentos. El conteo de carbohidratos significa simplemente sumar la cantidad total de gramos de carbohidratos a sus comidas o colaciones. Trate de consumir la misma cantidad en cada comida. A continuacin encontrar una lista de 1 porcin o 15 gr. de carbohidratos. A continuacin se enumeran. Pregunte al mdico cuntos gramos de carbohidratos necesita comer en cada comida o colacin. Almidones y granos  1 [place] de pan.   bollo ingls o bollo para hamburguesa o hotdog.   taza de cereal fro (sin azcar).   taza de pasta o arroz cocido.   taza de vegetales que contengan almidn (maz, papas, arvejas, porotos, calabaza).  1 omelette (6 pulgadas).   bollo.  1 waffle o panqueque (del tamao de un CD).   taza de cereales cocidos.  4 a 6 galletas saldas pequeas. *Se recomienda el consumo de granos enteros. Frutas  1 taza de frutos rojos, meln, papaya o anan sin azcar.  1 fruta fresca pequea.   banana o mango.   taza de jugo de frutas (4 onzas sin endulzar).    taza de fruta envasada en jugo natural o agua.  2 cucharadas de frutas secas.  12-15 uvas o cerezas. Leche y yogurt  1 taza de [employer] o al 1%.  [place].  6 onzas de yogurt descremado con edulcorante sin azcar.  6 onzas de yogur descremado de soja.  6 onzas de yogur natural. Vegetales  1 taza de vegetales crudos o  de vegetales cocidos se considera cero carbohidratos o una comida "libre".  Si come 3 o ms porciones en una comida, cuntelas como 1 porcin de carbohidratos. Otros carbohidratos   onzas de chips o pretzels.   taza de helado de crema o yogur helado.   taza de helado de agua.  5 cm de torta no congelada.  1 cucharada de miel, azcar, mermelada, jalea o almbar.  2 galletitas dulces pequeas.  3 cuadrados de crackers de graham.  3 tazas de palomitas de maz.  6 crackers.  1 taza de caldo.  Cuente 1 taza de guisado u otra mezcla de alimentos como 2 porciones de carbohidratos.  Los alimentos con menos de 20 caloras por porcin deben contarse como cero carbohidratos o alimento "libre". Si lo desea compre un libro o software de computacin que enumere la cantidad de gramos de carbohidratos de los diferentes alimentos. Adems, el panel nutricional en las etiquetas de los productos que consume es una buena fuente de informacin. Le indicar el tamao de la porcin y la cantidad total de carbohidratos que consumir por cada una. Divida este nmero por 15 para obtener el nmero  de conteo de carbohidratos por porcin. Recuerde: cada porcin son 15 gramos de carbohidratos. PORCIONES La medicin de los alimentos y el tamao de las porciones lo ayudarn a controlar la cantidad exacta de comida que debe ingerir. La lista que sigue le mostrar el tamao de algunas porciones comunes.   1 onza.................4 dados apilados.  3 onzas...............Un mazo de cartas.  1 cucharadita.....La punta de un dedo pequeo.  1  cucharada........Un dedo.  2 cucharadas......Una pelota de golf.   taza...............La mitad de un puo.  1 taza................Un puo. EJEMPLO DE PLAN DE ALIMENTACIN PARA DIABTICOS: A continuacin se muestra un ejemplo de plan de alimentacin que incluye comidas de los grupos de granos y fculas, vegetales, frutas y carnes. Un nutricionista podr confeccionarle un plan individualizado para cubrir sus necesidades calricas y decirle el nmero de porciones que necesita de cada grupo. Sin embargo, podra intercambiar los alimentos que contengan carbohidratos (lcteos, cereales y frutas). Controlar la cantidad total de carbohidratos en los alimentos o colaciones es ms importante que asegurarse de incluir todos los grupos alimenticios cada vez que come.  El siguiente plan de alimentacin es un ejemplo de una dieta de 2000 caloras mediante el conteo de carbohidratos. Este plan contiene 17 porciones de carbohidratos. Desayuno  1 taza de avena (2 porciones de carbohidratos).   taza de yogur light(1 porcin de carbohidratos).  1 taza de arndanos (1 porcin de carbohidratos).   taza de almendras. Colacin  1 manzana grande (2 porciones de carbohidratos).  1 palito de queso bajo en grasa. Almuerzo  Ensalada de pechuga de pollo.  1 taza de espinacas.   taza de tomates cortados.  2 oz (60 gr) de pechuga de pollo en rebanadas.  2 cucharadas de aderezo italiano bajo en contenido graso.  12 galletas integrales (2 porciones de carbohidratos).  12 a 15 uvas (1 porcin de carbohidratos).  1 taza de leche descremada (1porcin de carbohidratos). Colacin  1 taza de zanahorias.   taza de pur de garbanzos (1 porcin de carbohidratos). Cena  3 oz (80 gr) de salmn a la parrilla.  1 taza de arroz integral (3 porciones de carbohidratos). Colacin  1  taza de brcoli al vapor (1 porcin de carbohidrato) con una cucharadita de aceite de oliva y jugo de limn.  1 taza de  budn light (2 porciones de carbohidratos). HOJA DE PLANEAMIENTO DE LA ALIMENTACIN: El dietista podr utilizar esta hoja para ayudarlo a decidir cuntas porciones y qu tipos de alimentos son los adecuados para usted.  DESAYUNO Grupo de alimentos y porciones / Alimento elegido Granos/Fculas_________________________________________________ Lcteos________________________________________________________ Vegetales ______________________________________________________ Fruta __________________________________________________________ Carnes _________________________________________________________ Grasas _________________________________________________________ ALMUERZO Grupo de alimentos y porciones / Alimento elegido Granos/Fculas___________________________________________________ Lcteos_________________________________________________________ Fruta ___________________________________________________________ Carnes __________________________________________________________ Grasas __________________________________________________________ CENA Grupo de alimentos y porciones / Alimento elegido Granos/Fculas___________________________________________________ Lcteos_________________________________________________________ Fruta ___________________________________________________________ Carnes __________________________________________________________ Grasas __________________________________________________________ COLACIN Grupo de alimentos y porciones / Alimento elegido Granos/Fculas_________________________________________________ Lcteos________________________________________________________ Vegetales ______________________________________________________ Fruta _________________________________________________________ Carnes ________________________________________________________ Grasas ________________________________________________________ TOTALES  DIARIOS Fculas_______________________________________________________ Vegetales _____________________________________________________ Fruta ________________________________________________________ Lcteos_______________________________________________________ Carnes________________________________________________________ Grasas ________________________________________________________ Document Released: 08/12/2007 Document Revised: 07/28/2011 ExitCare Patient Information 2014 [employer].  

## 2013-11-21 ENCOUNTER — Ambulatory Visit: Payer: No Typology Code available for payment source | Attending: Internal Medicine | Admitting: Internal Medicine

## 2013-11-21 ENCOUNTER — Encounter: Payer: Self-pay | Admitting: Internal Medicine

## 2013-11-21 VITALS — BP 125/83 | HR 72 | Temp 98.0°F | Resp 16 | Wt 160.2 lb

## 2013-11-21 DIAGNOSIS — E089 Diabetes mellitus due to underlying condition without complications: Secondary | ICD-10-CM

## 2013-11-21 DIAGNOSIS — I10 Essential (primary) hypertension: Secondary | ICD-10-CM | POA: Insufficient documentation

## 2013-11-21 DIAGNOSIS — E119 Type 2 diabetes mellitus without complications: Secondary | ICD-10-CM | POA: Insufficient documentation

## 2013-11-21 DIAGNOSIS — E139 Other specified diabetes mellitus without complications: Secondary | ICD-10-CM

## 2013-11-21 LAB — GLUCOSE, POCT (MANUAL RESULT ENTRY): POC Glucose: 182 mg/dl — AB (ref 70–99)

## 2013-11-21 LAB — POCT GLYCOSYLATED HEMOGLOBIN (HGB A1C): HEMOGLOBIN A1C: 7.9

## 2013-11-21 MED ORDER — METFORMIN HCL 1000 MG PO TABS: 1000.0000 mg | ORAL_TABLET | Freq: Two times a day (BID) | ORAL | Status: DC

## 2013-11-21 MED ORDER — RAMIPRIL 2.5 MG PO CAPS: 2.5000 mg | ORAL_CAPSULE | Freq: Every day | ORAL | Status: DC

## 2013-11-21 MED ORDER — GLIPIZIDE 10 MG PO TABS: 20.0000 mg | ORAL_TABLET | Freq: Two times a day (BID) | ORAL | Status: DC

## 2013-11-21 NOTE — Patient Instructions (Signed)
La diabetes mellitus y los alimentos (Diabetes Mellitus and Food) Es importante que controle su nivel de azcar en la sangre (glucosa). El nivel de glucosa en sangre depende en gran medida de lo que usted come. [place] alimentos saludables en las cantidades [place] a lo largo del Training and development officer, aproximadamente a la misma hora [employer], lo ayudar a Chief Technology Officer su nivel de Multimedia programmer. Tambin puede ayudarlo a retrasar o Patent attorney de la diabetes mellitus. [place]er de [employer] saludable incluso puede ayudarlo a Chartered loss adjuster de presin arterial y a Science writer o Theatre manager un peso saludable.  CMO PUEDEN AFECTARME LOS ALIMENTOS? Carbohidratos Los carbohidratos afectan el nivel de glucosa en sangre ms que cualquier otro tipo de alimento. El nutricionista lo ayudar a Teacher, adult education cuntos carbohidratos puede consumir en cada comida y ensearle a contarlos. El recuento de carbohidratos es importante para mantener la glucosa en sangre en un nivel saludable, en especial si utiliza insulina o toma determinados medicamentos para la diabetes mellitus. Alcohol El alcohol puede provocar disminuciones sbitas de la glucosa en sangre (hipoglucemia), en especial si utiliza insulina o toma determinados medicamentos para la diabetes mellitus. La hipoglucemia es una afeccin que puede poner en peligro la vida. Los sntomas de la hipoglucemia (somnolencia, mareos y Data processing manager) son similares a los sntomas de haber consumido mucho alcohol.  Si el mdico lo autoriza a beber alcohol, hgalo con moderacin y siga estas pautas:  Las mujeres no deben beber ms de un trago por da, y los hombres no deben beber ms de dos tragos por Training and development officer. Un trago es igual a:  12 onzas (355 ml) de cerveza  5 onzas de vino (150 ml) de vino  1,5onzas (35ml) de bebidas espirituosas  No beba con el estmago vaco.  Mantngase hidratado. Beba agua, gaseosas dietticas o t helado sin azcar.  Las gaseosas comunes, los jugos y  otros refrescos podran contener muchos carbohidratos y se Civil Service fast streamer. QU ALIMENTOS NO SE RECOMIENDAN? Cuando haga las elecciones de alimentos, es importante que recuerde que todos los alimentos son distintos. Algunos tienen menos nutrientes que otros por porcin, aunque podran tener la misma cantidad de caloras o carbohidratos. Es difcil darle al cuerpo lo que necesita cuando consume alimentos con menos nutrientes. Estos son algunos ejemplos de alimentos que debera evitar ya que contienen muchas caloras y carbohidratos, pero pocos nutrientes:  Physicist, medical trans (la mayora de los alimentos procesados incluyen grasas trans en la etiqueta de Informacin nutricional).  Gaseosas comunes.  Jugos.  Caramelos.  Dulces, como tortas, pasteles, rosquillas y [place].  Comidas fritas. QU ALIMENTOS PUEDO COMER? Consuma alimentos ricos en nutrientes, que nutrirn el cuerpo y lo mantendrn saludable. Los alimentos que debe comer tambin dependern de varios factores, como:  Las caloras que necesita.  Los medicamentos que toma.  Su peso.  El nivel de glucosa en [place].  El [place] de presin arterial.  El nivel de colesterol. Tambin debe consumir una variedad de [place], como:  Protenas, como carne, aves, pescado, tofu, frutos secos y semillas (las protenas de [place] magros son mejores).  Lambert Mody.  Verduras.  Productos lcteos, como [place], queso y yogur (descremados son mejores).  Panes, granos, pastas, cereales, arroz y frijoles.  Grasas, como aceite de [place], [place] sin grasas trans, aceite de canola, aguacate y [place]. TODOS LOS QUE PADECEN DIABETES MELLITUS TIENEN EL [place] PLAN DE [place]? Dado que todas las personas que padecen diabetes mellitus son distintas, no hay un solo plan de comidas que funcione para todos. Es muy  importante que se rena con un nutricionista que lo ayudar a crear un plan de comidas adecuado para usted. Document Released: 08/12/2007  Document Revised: 05/10/2013 [facility] Patient Information 2015 [place]. This information is not intended to replace advice given to you by your health care provider. Make sure you discuss any questions you have with your health care provider.

## 2013-11-21 NOTE — Progress Notes (Signed)
MRN: [id number] Name: Alyssa Garrison  Sex: female Age: 38 y.o. DOB: 1976-02-28  Allergies: Review of patient's allergies indicates no known allergies.  Chief Complaint  Patient presents with  . Follow-up    HPI: Patient is 38 y.o. female who has history of diabetes, hypertension comes today for followup, she denies any hypoglycemic symptoms as per patient usually her fasting sugar is between 100 to 150 mg/dL, her hemoglobin A1c is also improved, her blood pressure is also better than last visit currently she is taking metformin 1 g twice a day, Glucotrol 10 mg 2 times, ramipril 2.5 mg everyday.  Past Medical History  Diagnosis Date  . Diabetes mellitus without complication     History reviewed. No pertinent past surgical history.    Medication List       This list is accurate as of: 11/21/13  9:50 AM.  Always use your most recent med list.               aspirin 81 MG tablet  Take 1 tablet (81 mg total) by mouth daily.     freestyle lancets  Use as instructed     glipiZIDE 10 MG tablet  Commonly known as:  GLUCOTROL  Take 2 tablets (20 mg total) by mouth 2 (two) times daily before a meal.     glucose blood test strip  Use as instructed     glucose monitoring kit monitoring kit  1 each by Does not apply route as needed for other.     metFORMIN 1000 MG tablet  Commonly known as:  GLUCOPHAGE  Take 1 tablet (1,000 mg total) by mouth 2 (two) times daily with a meal.     ramipril 2.5 MG capsule  Commonly known as:  ALTACE  Take 1 capsule (2.5 mg total) by mouth daily.     terbinafine 250 MG tablet  Commonly known as:  LAMISIL  Take 250 mg by mouth daily.        Meds ordered this encounter  Medications  . glipiZIDE (GLUCOTROL) 10 MG tablet    Sig: Take 2 tablets (20 mg total) by mouth 2 (two) times daily before a meal.    Dispense:  180 tablet    Refill:  3  . metFORMIN (GLUCOPHAGE) 1000 MG tablet    Sig: Take 1 tablet (1,000 mg total) by mouth  2 (two) times daily with a meal.    Dispense:  180 tablet    Refill:  3  . ramipril (ALTACE) 2.5 MG capsule    Sig: Take 1 capsule (2.5 mg total) by mouth daily.    Dispense:  90 capsule    Refill:  3    Immunization History  Administered Date(s) Administered  . Influenza Split 01/24/2013  . Influenza Whole 03/14/2008    Family History  Problem Relation Age of Onset  . Diabetes Mother   . Diabetes Paternal Grandmother     History  Substance Use Topics  . Smoking status: Never Smoker   . Smokeless tobacco: Not on file  . Alcohol Use: Not on file    Review of Systems   As noted in HPI  Filed Vitals:   11/21/13 0928  BP: 125/83  Pulse: 72  Temp: 98 F (36.7 C)  Resp: 16    Physical Exam  Physical Exam  Constitutional: No distress.  Eyes: EOM are normal. Pupils are equal, round, and reactive to light.  Cardiovascular: Normal rate and regular rhythm.   Pulmonary/Chest: Breath  sounds normal. No respiratory distress. She has no wheezes. She has no rales.  Musculoskeletal: She exhibits no edema.    CBC    Component Value Date/Time   WBC 8.9 01/12/2013 1807   RBC 4.57 01/12/2013 1807   HGB 12.1 01/12/2013 1807   HCT 36.0 01/12/2013 1807   PLT 319 01/12/2013 1807   MCV 78.8 01/12/2013 1807   LYMPHSABS 2.8 01/12/2013 1807   MONOABS 0.6 01/12/2013 1807   EOSABS 0.1 01/12/2013 1807   BASOSABS 0.0 01/12/2013 1807    CMP     Component Value Date/Time   NA 137 01/12/2013 1807   K 4.2 01/12/2013 1807   CL 102 01/12/2013 1807   CO2 24 01/12/2013 1807   GLUCOSE 218* 01/12/2013 1807   BUN 9 01/12/2013 1807   CREATININE 0.60 01/12/2013 1807   CREATININE 0.51 03/08/2009 1005   CALCIUM 9.4 01/12/2013 1807   PROT 8.1 01/12/2013 1807   ALBUMIN 4.6 01/12/2013 1807   AST 15 01/12/2013 1807   ALT 11 01/12/2013 1807   ALKPHOS 62 01/12/2013 1807   BILITOT 0.4 01/12/2013 1807   GFRNONAA >60 03/08/2009 1005   GFRAA  Value: >60        The eGFR has been calculated using the MDRD equation.  This calculation has not been validated in all clinical situations. eGFR's persistently <60 mL/min signify possible Chronic Kidney Disease. 03/08/2009 1005    Lab Results  Component Value Date/Time   CHOL 169 01/12/2013  6:07 PM    No components found with this basename: hga1c    Lab Results  Component Value Date/Time   AST 15 01/12/2013  6:07 PM    Assessment and Plan  Diabetes mellitus due to underlying condition without complications - Plan:  Results for orders placed in visit on 11/21/13  GLUCOSE, POCT (MANUAL RESULT ENTRY)      Result Value Ref Range   POC Glucose 182 (*) 70 - 99 mg/dl  POCT GLYCOSYLATED HEMOGLOBIN (HGB A1C)      Result Value Ref Range   Hemoglobin A1C 7.9     A1c has trended down from  9.6%, patient will continue with metformin, I have increased the dose of Glucotrol to 20 mg 3 times a day, also advise patient to keep the fingerstick log, will repeat A1c on the next visit  , glipiZIDE (GLUCOTROL) 10 MG tablet, metFORMIN (GLUCOPHAGE) 1000 MG tablet  Essential hypertension, benign - Plan:Well controlled continue with ramipril (ALTACE) 2.5 MG capsule We'll check fasting lipid panel on the next visit.  Return in about 3 months (around 02/21/2014) for diabetes, hypertension.  Lorayne Marek, MD

## 2013-11-21 NOTE — Progress Notes (Signed)
Interpreter line used Patient here for follow up on her Diabetes

## 2014-02-27 ENCOUNTER — Telehealth: Payer: Self-pay | Admitting: Internal Medicine

## 2014-02-27 ENCOUNTER — Ambulatory Visit: Payer: Self-pay | Attending: Internal Medicine

## 2014-02-27 NOTE — Telephone Encounter (Signed)
Left message to return call. Pt needs to make F/U DM apt

## 2014-02-27 NOTE — Telephone Encounter (Signed)
Patient needs medication refill for glipzide 10 mg. Please follow up with Patient. Patient ONLY speaks Spanish.

## 2014-03-08 ENCOUNTER — Ambulatory Visit: Payer: Self-pay | Attending: Internal Medicine | Admitting: Internal Medicine

## 2014-03-08 ENCOUNTER — Encounter: Payer: Self-pay | Admitting: Internal Medicine

## 2014-03-08 VITALS — BP 158/90 | HR 71 | Temp 98.4°F | Resp 20 | Ht 59.0 in | Wt 158.0 lb

## 2014-03-08 DIAGNOSIS — I10 Essential (primary) hypertension: Secondary | ICD-10-CM | POA: Insufficient documentation

## 2014-03-08 DIAGNOSIS — Z139 Encounter for screening, unspecified: Secondary | ICD-10-CM

## 2014-03-08 DIAGNOSIS — Z833 Family history of diabetes mellitus: Secondary | ICD-10-CM | POA: Insufficient documentation

## 2014-03-08 DIAGNOSIS — E119 Type 2 diabetes mellitus without complications: Secondary | ICD-10-CM | POA: Insufficient documentation

## 2014-03-08 DIAGNOSIS — Z7982 Long term (current) use of aspirin: Secondary | ICD-10-CM | POA: Insufficient documentation

## 2014-03-08 DIAGNOSIS — Z13820 Encounter for screening for osteoporosis: Secondary | ICD-10-CM | POA: Insufficient documentation

## 2014-03-08 LAB — COMPLETE METABOLIC PANEL WITH GFR
ALT: 14 U/L (ref 0–35)
AST: 18 U/L (ref 0–37)
Albumin: 4.3 g/dL (ref 3.5–5.2)
Alkaline Phosphatase: 60 U/L (ref 39–117)
BUN: 10 mg/dL (ref 6–23)
CO2: 23 meq/L (ref 19–32)
CREATININE: 0.57 mg/dL (ref 0.50–1.10)
Calcium: 8.8 mg/dL (ref 8.4–10.5)
Chloride: 103 mEq/L (ref 96–112)
GFR, Est African American: >89 mL/min
Glucose, Bld: 124 mg/dL — ABNORMAL HIGH (ref 70–99)
Potassium: 4.2 mEq/L (ref 3.5–5.3)
Sodium: 137 mEq/L (ref 135–145)
Total Bilirubin: 0.6 mg/dL (ref 0.2–1.2)
Total Protein: 7.7 g/dL (ref 6.0–8.3)

## 2014-03-08 LAB — POCT GLYCOSYLATED HEMOGLOBIN (HGB A1C): Hemoglobin A1C: 8.6

## 2014-03-08 LAB — LIPID PANEL
Cholesterol: 166 mg/dL (ref 0–200)
HDL: 51 mg/dL (ref >39)
LDL Cholesterol: 99 mg/dL (ref 0–99)
TRIGLYCERIDES: 78 mg/dL (ref <150)
Total CHOL/HDL Ratio: 3.3 Ratio
VLDL: 16 mg/dL (ref 0–40)

## 2014-03-08 LAB — GLUCOSE, POCT (MANUAL RESULT ENTRY): POC GLUCOSE: 129 mg/dL — AB (ref 70–99)

## 2014-03-08 MED ORDER — GLIPIZIDE 10 MG PO TABS: 20.0000 mg | ORAL_TABLET | Freq: Two times a day (BID) | ORAL | Status: DC

## 2014-03-08 MED ORDER — RAMIPRIL 2.5 MG PO CAPS: 2.5000 mg | ORAL_CAPSULE | Freq: Every day | ORAL | Status: DC

## 2014-03-08 MED ORDER — SITAGLIPTIN PHOS-METFORMIN HCL 50-1000 MG PO TABS: 1.0000 | ORAL_TABLET | Freq: Two times a day (BID) | ORAL | Status: DC

## 2014-03-08 NOTE — Progress Notes (Signed)
MRN: [id number] Name: Alyssa Garrison  Sex: female Age: 38 y.o. DOB: 11-Feb-1976  Allergies: Review of patient's allergies indicates no known allergies.  Chief Complaint  Patient presents with  . Diabetes    Patient in for followup of diabetes and B/P, says fasting glucose around 150 to 160.    HPI: Patient is 38 y.o. female who has history of diabetes hypertension comes today for followup, she is taking metformin 1 g twice a day and was taking Glucotrol 20 mg 2 times a day as per patient for the last 3-4 days she ran out of her Glucotrol medication and was taking half the dose but usually her fasting blood sugars more than 140 mg/dL, denies any hypoglycemic symptoms, her hemoglobin A1c has trended up her compared to last visit, she also has not had any recent blood work done. Her blood pressure is borderline elevated she denies any headache dizziness or any acute symptoms.   Past Medical History  Diagnosis Date  . Diabetes mellitus without complication     History reviewed. No pertinent past surgical history.    Medication List       This list is accurate as of: 03/08/14 10:16 AM.  Always use your most recent med list.               aspirin 81 MG tablet  Take 1 tablet (81 mg total) by mouth daily.     freestyle lancets  Use as instructed     glipiZIDE 10 MG tablet  Commonly known as:  GLUCOTROL  Take 2 tablets (20 mg total) by mouth 2 (two) times daily before a meal.     glucose blood test strip  Use as instructed     glucose monitoring kit monitoring kit  1 each by Does not apply route as needed for other.     ramipril 2.5 MG capsule  Commonly known as:  ALTACE  Take 1 capsule (2.5 mg total) by mouth daily.     sitaGLIPtin-metformin 50-1000 MG per tablet  Commonly known as:  JANUMET  Take 1 tablet by mouth 2 (two) times daily with a meal.     terbinafine 250 MG tablet  Commonly known as:  LAMISIL  Take 250 mg by mouth daily.        Meds  ordered this encounter  Medications  . sitaGLIPtin-metformin (JANUMET) 50-1000 MG per tablet    Sig: Take 1 tablet by mouth 2 (two) times daily with a meal.    Dispense:  60 tablet    Refill:  3  . glipiZIDE (GLUCOTROL) 10 MG tablet    Sig: Take 2 tablets (20 mg total) by mouth 2 (two) times daily before a meal.    Dispense:  120 tablet    Refill:  3  . ramipril (ALTACE) 2.5 MG capsule    Sig: Take 1 capsule (2.5 mg total) by mouth daily.    Dispense:  90 capsule    Refill:  3    Immunization History  Administered Date(s) Administered  . Influenza Split 01/24/2013  . Influenza Whole 03/14/2008    Family History  Problem Relation Age of Onset  . Diabetes Mother   . Diabetes Paternal Grandmother     History  Substance Use Topics  . Smoking status: Never Smoker   . Smokeless tobacco: Not on file  . Alcohol Use: Not on file    Review of Systems   As noted in HPI  Filed Vitals:   03/08/14  0913  BP: 158/90  Pulse: 71  Temp: 98.4 F (36.9 C)  Resp: 20    Physical Exam  Physical Exam  Constitutional: No distress.  Eyes: EOM are normal. Pupils are equal, round, and reactive to light.  Cardiovascular: Normal rate and regular rhythm.   Pulmonary/Chest: Breath sounds normal. No respiratory distress. She has no wheezes. She has no rales.  Musculoskeletal: She exhibits no edema.    CBC    Component Value Date/Time   WBC 8.9 01/12/2013 1807   RBC 4.57 01/12/2013 1807   HGB 12.1 01/12/2013 1807   HCT 36.0 01/12/2013 1807   PLT 319 01/12/2013 1807   MCV 78.8 01/12/2013 1807   LYMPHSABS 2.8 01/12/2013 1807   MONOABS 0.6 01/12/2013 1807   EOSABS 0.1 01/12/2013 1807   BASOSABS 0.0 01/12/2013 1807    CMP     Component Value Date/Time   NA 137 01/12/2013 1807   K 4.2 01/12/2013 1807   CL 102 01/12/2013 1807   CO2 24 01/12/2013 1807   GLUCOSE 218* 01/12/2013 1807   BUN 9 01/12/2013 1807   CREATININE 0.60 01/12/2013 1807   CREATININE 0.51 03/08/2009 1005   CALCIUM 9.4  01/12/2013 1807   PROT 8.1 01/12/2013 1807   ALBUMIN 4.6 01/12/2013 1807   AST 15 01/12/2013 1807   ALT 11 01/12/2013 1807   ALKPHOS 62 01/12/2013 1807   BILITOT 0.4 01/12/2013 1807   GFRNONAA >60 03/08/2009 1005   GFRAA  Value: >60        The eGFR has been calculated using the MDRD equation. This calculation has not been validated in all clinical situations. eGFR's persistently <60 mL/min signify possible Chronic Kidney Disease. 03/08/2009 1005    Lab Results  Component Value Date/Time   CHOL 169 01/12/2013  6:07 PM    No components found with this basename: hga1c    Lab Results  Component Value Date/Time   AST 15 01/12/2013  6:07 PM    Assessment and Plan  Type 2 diabetes mellitus not at goal - Plan:  Results for orders placed in visit on 03/08/14  GLUCOSE, POCT (MANUAL RESULT ENTRY)      Result Value Ref Range   POC Glucose 129 (*) 70 - 99 mg/dl  POCT GLYCOSYLATED HEMOGLOBIN (HGB A1C)      Result Value Ref Range   Hemoglobin A1C 8.6     Advised patient for diabetes meal planning, she'll continue with her metformin and Glucotrol, I have added Januvia, will repeat A1c in 3 months. Glucose (CBG), HgB A1c, sitaGLIPtin-metformin (JANUMET) 50-1000 MG per tablet, glipiZIDE (GLUCOTROL) 10 MG tablet, Lipid panel,   Essential hypertension, benign - Plan: Advised patient for DASH diet, continue with ramipril (ALTACE) 2.5 MG capsule, COMPLETE METABOLIC PANEL WITH GFR  Screening - Plan: Vit D  25 hydroxy (rtn osteoporosis monitoring)    Return in about 3 months (around 06/08/2014) for diabetes, hypertension.  Lorayne Marek, MD

## 2014-03-08 NOTE — Progress Notes (Signed)
Patient in for diabetes and b/p followup.  Fasting glucose around 155- 166.

## 2014-03-08 NOTE — Patient Instructions (Signed)
La diabetes mellitus y los alimentos (Diabetes Mellitus and Food) Es importante que controle su nivel de azcar en la sangre (glucosa). El nivel de glucosa en sangre depende en gran medida de lo que usted come. [place] alimentos saludables en las cantidades [place] a lo largo del Training and development officer, aproximadamente a la misma hora [employer], lo ayudar a Chief Technology Officer su nivel de Multimedia programmer. Tambin puede ayudarlo a retrasar o Patent attorney de la diabetes mellitus. Comer de [employer] saludable incluso puede ayudarlo a Chartered loss adjuster de presin arterial y a Science writer o Theatre manager un peso saludable.  CMO PUEDEN AFECTARME LOS ALIMENTOS? Carbohidratos Los carbohidratos afectan el nivel de glucosa en sangre ms que cualquier otro tipo de alimento. El nutricionista lo ayudar a Teacher, adult education cuntos carbohidratos puede consumir en cada comida y ensearle a contarlos. El recuento de carbohidratos es importante para mantener la glucosa en sangre en un nivel saludable, en especial si utiliza insulina o toma determinados medicamentos para la diabetes mellitus. Alcohol El alcohol puede provocar disminuciones sbitas de la glucosa en sangre (hipoglucemia), en especial si utiliza insulina o toma determinados medicamentos para la diabetes mellitus. La hipoglucemia es una afeccin que puede poner en peligro la vida. Los sntomas de la hipoglucemia (somnolencia, mareos y Data processing manager) son similares a los sntomas de haber consumido mucho alcohol.  Si el mdico lo autoriza a beber alcohol, hgalo con moderacin y siga estas pautas:  Las mujeres no deben beber ms de un trago por da, y los hombres no deben beber ms de dos tragos por Training and development officer. Un trago es igual a:  12 onzas (355 ml) de cerveza  5 onzas de vino (150 ml) de vino  1,5onzas (35ml) de bebidas espirituosas  No beba con el estmago vaco.  Mantngase hidratado. Beba agua, gaseosas dietticas o t helado sin azcar.  Las gaseosas comunes, los jugos y  otros refrescos podran contener muchos carbohidratos y se Civil Service fast streamer. QU ALIMENTOS NO SE RECOMIENDAN? Cuando haga las elecciones de alimentos, es importante que recuerde que todos los alimentos son distintos. Algunos tienen menos nutrientes que otros por porcin, aunque podran tener la misma cantidad de caloras o carbohidratos. Es difcil darle al cuerpo lo que necesita cuando consume alimentos con menos nutrientes. Estos son algunos ejemplos de alimentos que debera evitar ya que contienen muchas caloras y carbohidratos, pero pocos nutrientes:  Physicist, medical trans (la mayora de los alimentos procesados incluyen grasas trans en la etiqueta de Informacin nutricional).  Gaseosas comunes.  Jugos.  Caramelos.  Dulces, como tortas, pasteles, rosquillas y [place].  Comidas fritas. QU ALIMENTOS PUEDO COMER? Consuma alimentos ricos en nutrientes, que nutrirn el cuerpo y lo mantendrn saludable. Los alimentos que debe comer tambin dependern de varios factores, como:  Las caloras que necesita.  Los medicamentos que toma.  Su peso.  El nivel de glucosa en [place].  El [place] de presin arterial.  El nivel de colesterol. Tambin debe consumir una variedad de [place], como:  Protenas, como carne, aves, pescado, tofu, frutos secos y semillas (las protenas de [place] magros son mejores).  Lambert Mody.  Verduras.  Productos lcteos, como Goff, queso y yogur (descremados son mejores).  Panes, granos, pastas, cereales, arroz y frijoles.  Grasas, como aceite de [place], [place] sin grasas trans, aceite de canola, aguacate y [place]. TODOS LOS QUE PADECEN DIABETES MELLITUS TIENEN EL [place] PLAN DE [place]? Dado que todas las personas que padecen diabetes mellitus son distintas, no hay un solo plan de comidas que funcione para todos. Es muy  importante que se rena con un nutricionista que lo ayudar a crear un plan de comidas adecuado para usted. Document Released: 08/12/2007  Document Revised: 05/10/2013 [facility] Patient Information 2015 [place]. This information is not intended to replace advice given to you by your health care provider. Make sure you discuss any questions you have with your health care provider. Plan de alimentacin DASH (DASH Eating Plan) DASH es la sigla en ingls de "Enfoques Alimentarios para Detener la Hipertensin". El plan de alimentacin DASH ha demostrado bajar la presin arterial elevada (hipertensin). Los beneficios adicionales para la salud pueden incluir la disminucin del riesgo de diabetes mellitus tipo2, enfermedades cardacas e ictus. Este plan tambin puede ayudar a Horticulturist, commercial. QU DEBO SABER ACERCA DEL PLAN DE ALIMENTACIN DASH? Para el plan de alimentacin DASH, seguir las siguientes pautas generales:  Elija los alimentos con un valor porcentual diario de sodio de menos del 5% (segn figura en la etiqueta del alimento).  Use hierbas o aderezos sin sal, en lugar de sal de mesa o sal marina.  Consulte al mdico o farmacutico antes de usar sustitutos de la sal.  Coma productos con bajo contenido de sodio, cuya etiqueta suele decir "bajo contenido de sodio" o "sin agregado de sal".  Coma alimentos frescos.  Coma ms verduras, frutas y productos lcteos con bajo contenido de [place].  Elija los cereales integrales. Busque la palabra "integral" en Equities trader de la lista de ingredientes.  Elija el pescado y el pollo o el pavo sin piel ms a menudo que las carnes rojas. Limite el consumo de pescado, carne de ave y carne a 6onzas (170g) por Training and development officer.  Limite el consumo de dulces, postres, azcares y bebidas azucaradas.  Elija las grasas saludables para el corazn.  Limite el consumo de queso a 1onza (28g) por Training and development officer.  Consuma ms comida casera y menos de restaurante, de buf y comida rpida.  Limite el consumo de alimentos fritos.  Cocine los alimentos utilizando mtodos que no sean la fritura.  Limite las  verduras enlatadas. Si las consume, enjuguelas bien para disminuir el sodio.  Cuando coma en un restaurante, pida que preparen su comida con menos sal o, en lo posible, sin nada de sal. QU ALIMENTOS PUEDO COMER? Pida ayuda a un nutricionista para conocer las necesidades calricas individuales. Cereales Pan de salvado o integral. Arroz integral. Pastas de salvado o integrales. Quinua, trigo burgol y cereales integrales. Cereales con bajo contenido de sodio. Tortillas de harina de maz o de salvado. Pan de maz integral. Galletas saladas integrales. Galletas con bajo contenido de [place]. Vegetales Verduras frescas o congeladas (crudas, al vapor, asadas o grilladas). Jugos de tomate y verduras con contenido bajo o reducido de sodio. Pasta y salsa de tomate con contenido bajo o [place]. Verduras enlatadas con bajo contenido de sodio o reducido de sodio.  Lambert Mody Lambert Mody frescas, en conserva (en su jugo natural) o frutas congeladas. Carnes y otros productos con protenas Carne de res molida (al 85% o ms [place]), carne de res de animales alimentados con pastos o carne de res sin la grasa. Pollo o pavo sin piel. Carne de pollo o de [place]. Cerdo sin la grasa. Todos los pescados y frutos de mar. Huevos. Porotos, guisantes o lentejas secos. Frutos secos y semillas sin sal. Frijoles enlatados sin sal. Lcteos Productos lcteos con bajo contenido de grasas, como [place] o al 1%, quesos reducidos en grasas o al 2%, ricota con bajo contenido de grasas o [employer],  o yogur natural con bajo contenido de [place]. Quesos con contenido bajo o reducido de sodio. Grasas y Naval architect en barra que no contengan grasas trans. [place] y alios para ensaladas livianos o reducidos en grasas (reducidos en sodio). [place]. Aceites de crtamo, oliva o canola. [place] natural de man o almendra. Otros Palomitas de maz y pretzels sin sal. Los artculos mencionados arriba pueden no ser  [employer] de las bebidas o los alimentos recomendados. Comunquese con el nutricionista para conocer ms opciones. QU ALIMENTOS NO SE RECOMIENDAN? Cereales Pan blanco. Pastas blancas. Arroz blanco. Pan de maz refinado. Bagels y croissants. Galletas saladas que contengan grasas trans. Vegetales Vegetales con crema o fritos. Verduras en [place]. Verduras enlatadas comunes. Pasta y salsa de tomate en lata comunes. Jugos comunes de tomate y de verduras. Lambert Mody Frutas secas. Fruta enlatada en almbar liviano o espeso. Jugo de frutas. Carnes y otros productos con protenas [place] de carne con Lobbyist. Costillas, alas de pollo, tocineta, salchicha, mortadela, salame, chinchulines, tocino, perros calientes, salchichas alemanas y embutidos envasados. Frutos secos y semillas con sal. Frijoles con sal en lata. Lcteos Leche entera o al 2%, crema, mezcla de [place] y crema, y queso crema. Yogur entero o endulzado. Quesos o queso azul con alto contenido de Physicist, medical. [place]rem[place] no lcteas y coberturas batidas. Quesos procesados, quesos para untar o cuajadas. Condimentos Sal de cebolla y ajo, sal condimentada, sal de mesa y sal marina. Salsas en lata y envasadas. Salsa [place]. Salsa trtara. Salsa barbacoa. Salsa teriyaki. Salsa de soja, incluso la que tiene contenido reducido de [place]. Salsa de carne. Salsa de pescado. Salsa de [place]. Salsa rosada. Rbano picante. Ketchup y mostaza. Saborizantes y tiernizantes para carne. Caldo en cubitos. Salsa picante. Salsa tabasco. Adobos. Aderezos para tacos. Salsas. Grasas y aceites [place], [place] en barra, [place] de [place], [place], [place] clarificada y Wendee Copp de tocino. Aceites de coco, de palmiste o de palma. Aderezos comunes para ensalada. Otros Pickles y [place]. Palomitas de maz y pretzels con sal. Los artculos mencionados arriba pueden no ser [employer] de las bebidas y los alimentos que se Higher education careers adviser. Comunquese con el  nutricionista para obtener ms informacin. DNDE Dolan Amen MS INFORMACIN? [place], del Pulmn y de la Sangre (National Heart, Lung, and [place]): travelstabloid.com Document Released: 04/24/2011 Document Revised: 09/19/2013 [facility] Patient Information 2015 [place], [place]. This information is not intended to replace advice given to you by your health care provider. Make sure you discuss any questions you have with your health care provider.

## 2014-03-09 LAB — VITAMIN D 25 HYDROXY (VIT D DEFICIENCY, FRACTURES): Vit D, 25-Hydroxy: 36 ng/mL (ref 30–89)

## 2014-06-16 ENCOUNTER — Ambulatory Visit: Payer: Self-pay | Attending: Internal Medicine | Admitting: Internal Medicine

## 2014-06-16 ENCOUNTER — Encounter: Payer: Self-pay | Admitting: Internal Medicine

## 2014-06-16 VITALS — BP 147/89 | HR 82 | Temp 98.6°F | Resp 16 | Wt 159.0 lb

## 2014-06-16 DIAGNOSIS — E119 Type 2 diabetes mellitus without complications: Secondary | ICD-10-CM | POA: Insufficient documentation

## 2014-06-16 DIAGNOSIS — Z23 Encounter for immunization: Secondary | ICD-10-CM

## 2014-06-16 DIAGNOSIS — I10 Essential (primary) hypertension: Secondary | ICD-10-CM

## 2014-06-16 DIAGNOSIS — E139 Other specified diabetes mellitus without complications: Secondary | ICD-10-CM

## 2014-06-16 LAB — COMPLETE METABOLIC PANEL WITH GFR
ALK PHOS: 57 U/L (ref 39–117)
ALT: 14 U/L (ref 0–35)
AST: 19 U/L (ref 0–37)
Albumin: 4.3 g/dL (ref 3.5–5.2)
BUN: 10 mg/dL (ref 6–23)
CO2: 24 meq/L (ref 19–32)
Calcium: 9.6 mg/dL (ref 8.4–10.5)
Chloride: 101 mEq/L (ref 96–112)
Creat: 0.61 mg/dL (ref 0.50–1.10)
GFR, Est African American: >89 mL/min
Glucose, Bld: 182 mg/dL — ABNORMAL HIGH (ref 70–99)
Potassium: 4.8 mEq/L (ref 3.5–5.3)
Sodium: 137 mEq/L (ref 135–145)
Total Bilirubin: 0.5 mg/dL (ref 0.2–1.2)
Total Protein: 8.1 g/dL (ref 6.0–8.3)

## 2014-06-16 LAB — GLUCOSE, POCT (MANUAL RESULT ENTRY): POC Glucose: 177 mg/dl — AB (ref 70–99)

## 2014-06-16 LAB — POCT GLYCOSYLATED HEMOGLOBIN (HGB A1C): HEMOGLOBIN A1C: 6.9

## 2014-06-16 MED ORDER — SITAGLIPTIN PHOS-METFORMIN HCL 50-1000 MG PO TABS
1.0000 | ORAL_TABLET | Freq: Two times a day (BID) | ORAL | Status: DC
Start: 2014-06-16 — End: 2014-09-14

## 2014-06-16 MED ORDER — GLIPIZIDE 10 MG PO TABS: 20.0000 mg | ORAL_TABLET | Freq: Two times a day (BID) | ORAL | Status: DC

## 2014-06-16 MED ORDER — RAMIPRIL 2.5 MG PO CAPS: 2.5000 mg | ORAL_CAPSULE | Freq: Every day | ORAL | Status: DC

## 2014-06-16 NOTE — Patient Instructions (Addendum)
La diabetes mellitus y los alimentos (Diabetes Mellitus and Food) Es importante que controle su nivel de azcar en la sangre (glucosa). El nivel de glucosa en sangre depende en gran medida de lo que usted come. [place] alimentos saludables en las cantidades [place] a lo largo del Training and development officer, aproximadamente a la misma hora [employer], lo ayudar a Chief Technology Officer su nivel de Multimedia programmer. Tambin puede ayudarlo a retrasar o Patent attorney de la diabetes mellitus. Comer de [employer] saludable incluso puede ayudarlo a Chartered loss adjuster de presin arterial y a Science writer o Theatre manager un peso saludable.  CMO PUEDEN AFECTARME LOS ALIMENTOS? Carbohidratos Los carbohidratos afectan el nivel de glucosa en sangre ms que cualquier otro tipo de alimento. El nutricionista lo ayudar a Teacher, adult education cuntos carbohidratos puede consumir en cada comida y ensearle a contarlos. El recuento de carbohidratos es importante para mantener la glucosa en sangre en un nivel saludable, en especial si utiliza insulina o toma determinados medicamentos para la diabetes mellitus. Alcohol El alcohol puede provocar disminuciones sbitas de la glucosa en sangre (hipoglucemia), en especial si utiliza insulina o toma determinados medicamentos para la diabetes mellitus. La hipoglucemia es una afeccin que puede poner en peligro la vida. Los sntomas de la hipoglucemia (somnolencia, mareos y Data processing manager) son similares a los sntomas de haber consumido mucho alcohol.  Si el mdico lo autoriza a beber alcohol, hgalo con moderacin y siga estas pautas:  Las mujeres no deben beber ms de un trago por da, y los hombres no deben beber ms de dos tragos por Training and development officer. Un trago es igual a:  12 onzas (355 ml) de cerveza  5 onzas de vino (150 ml) de vino  1,5onzas (35ml) de bebidas espirituosas  No beba con el estmago vaco.  Mantngase hidratado. Beba agua, gaseosas dietticas o t helado sin azcar.  Las gaseosas comunes, los jugos y  otros refrescos podran contener muchos carbohidratos y se Civil Service fast streamer. QU ALIMENTOS NO SE RECOMIENDAN? Cuando haga las elecciones de alimentos, es importante que recuerde que todos los alimentos son distintos. Algunos tienen menos nutrientes que otros por porcin, aunque podran tener la misma cantidad de caloras o carbohidratos. Es difcil darle al cuerpo lo que necesita cuando consume alimentos con menos nutrientes. Estos son algunos ejemplos de alimentos que debera evitar ya que contienen muchas caloras y carbohidratos, pero pocos nutrientes:  Physicist, medical trans (la mayora de los alimentos procesados incluyen grasas trans en la etiqueta de Informacin nutricional).  Gaseosas comunes.  Jugos.  Caramelos.  Dulces, como tortas, pasteles, rosquillas y [place].  Comidas fritas. QU ALIMENTOS PUEDO COMER? Consuma alimentos ricos en nutrientes, que nutrirn el cuerpo y lo mantendrn saludable. Los alimentos que debe comer tambin dependern de varios factores, como:  Las caloras que necesita.  Los medicamentos que toma.  Su peso.  El nivel de glucosa en [place].  El [place] de presin arterial.  El nivel de colesterol. Tambin debe consumir una variedad de [place], como:  Protenas, como carne, aves, pescado, tofu, frutos secos y semillas (las protenas de [place] magros son mejores).  Lambert Mody.  Verduras.  Productos lcteos, como Goff, queso y yogur (descremados son mejores).  Panes, granos, pastas, cereales, arroz y frijoles.  Grasas, como aceite de [place], [place] sin grasas trans, aceite de canola, aguacate y [place]. TODOS LOS QUE PADECEN DIABETES MELLITUS TIENEN EL [place] PLAN DE [place]? Dado que todas las personas que padecen diabetes mellitus son distintas, no hay un solo plan de comidas que funcione para todos. Es muy  importante que se rena con un nutricionista que lo ayudar a crear un plan de comidas adecuado para usted. Document Released: 08/12/2007  Document Revised: 05/10/2013 ExitCare Patient Information 2015 [employer]. This information is not intended to replace advice given to you by your health care provider. Make sure you discuss any questions you have with your health care provider. Plan de alimentacin DASH (DASH Eating Plan) DASH es la sigla en ingls de "Enfoques Alimentarios para Detener la Hipertensin". El plan de alimentacin DASH ha demostrado bajar la presin arterial elevada (hipertensin). Los beneficios adicionales para la salud pueden incluir la disminucin del riesgo de diabetes mellitus tipo2, enfermedades cardacas e ictus. Este plan tambin puede ayudar a adelgazar. QU DEBO SABER ACERCA DEL PLAN DE ALIMENTACIN DASH? Para el plan de alimentacin DASH, seguir las siguientes pautas generales:  Elija los alimentos con un valor porcentual diario de sodio de menos del 5% (segn figura en la etiqueta del alimento).  Use hierbas o aderezos sin sal, en lugar de sal de mesa o sal marina.  Consulte al mdico o farmacutico antes de usar sustitutos de la sal.  Coma productos con bajo contenido de sodio, cuya etiqueta suele decir "bajo contenido de sodio" o "sin agregado de sal".  Coma alimentos frescos.  Coma ms verduras, frutas y productos lcteos con bajo contenido de grasas.  Elija los cereales integrales. Busque la palabra "integral" en el primer lugar de la lista de ingredientes.  Elija el pescado y el pollo o el pavo sin piel ms a menudo que las carnes rojas. Limite el consumo de pescado, carne de ave y carne a 6onzas (170g) por da.  Limite el consumo de dulces, postres, azcares y bebidas azucaradas.  Elija las grasas saludables para el corazn.  Limite el consumo de queso a 1onza (28g) por da.  Consuma ms comida casera y menos de restaurante, de buf y comida rpida.  Limite el consumo de alimentos fritos.  Cocine los alimentos utilizando mtodos que no sean la fritura.  Limite las  verduras enlatadas. Si las consume, enjuguelas bien para disminuir el sodio.  Cuando coma en un restaurante, pida que preparen su comida con menos sal o, en lo posible, sin nada de sal. QU ALIMENTOS PUEDO COMER? Pida ayuda a un nutricionista para conocer las necesidades calricas individuales. Cereales Pan de salvado o integral. Arroz integral. Pastas de salvado o integrales. Quinua, trigo burgol y cereales integrales. Cereales con bajo contenido de sodio. Tortillas de harina de maz o de salvado. Pan de maz integral. Galletas saladas integrales. Galletas con bajo contenido de sodio. Vegetales Verduras frescas o congeladas (crudas, al vapor, asadas o grilladas). Jugos de tomate y verduras con contenido bajo o reducido de sodio. Pasta y salsa de tomate con contenido bajo o reducido de sodio. Verduras enlatadas con bajo contenido de sodio o reducido de sodio.  Frutas Frutas frescas, en conserva (en su jugo natural) o frutas congeladas. Carnes y otros productos con protenas Carne de res molida (al 85% o ms magra), carne de res de animales alimentados con pastos o carne de res sin la grasa. Pollo o pavo sin piel. Carne de pollo o de pavo molida. Cerdo sin la grasa. Todos los pescados y frutos de mar. Huevos. Porotos, guisantes o lentejas secos. Frutos secos y semillas sin sal. Frijoles enlatados sin sal. Lcteos Productos lcteos con bajo contenido de grasas, como leche descremada o al 1%, quesos reducidos en grasas o al 2%, ricota con bajo contenido de grasas o queso cottage,   o yogur natural con bajo contenido de grasas. Quesos con contenido bajo o reducido de sodio. Grasas y aceites [place] en barra que no contengan grasas trans. [place] y alios para ensaladas livianos o reducidos en grasas (reducidos en sodio). [place]. Aceites de crtamo, oliva o canola. [place] natural de man o almendra. Otros Palomitas de maz y pretzels sin sal. Los artculos mencionados arriba pueden no ser  una lista completa de las bebidas o los alimentos recomendados. Comunquese con el nutricionista para conocer ms opciones. QU ALIMENTOS NO SE RECOMIENDAN? Cereales Pan blanco. Pastas blancas. Arroz blanco. Pan de maz refinado. Bagels y croissants. Galletas saladas que contengan grasas trans. Vegetales Vegetales con crema o fritos. Verduras en salsa de queso. Verduras enlatadas comunes. Pasta y salsa de tomate en lata comunes. Jugos comunes de tomate y de verduras. Frutas Frutas secas. Fruta enlatada en almbar liviano o espeso. Jugo de frutas. Carnes y otros productos con protenas Cortes de carne con grasa. Costillas, alas de pollo, tocineta, salchicha, mortadela, salame, chinchulines, tocino, perros calientes, salchichas alemanas y embutidos envasados. Frutos secos y semillas con sal. Frijoles con sal en lata. Lcteos Leche entera o al 2%, crema, mezcla de leche y crema, y queso crema. Yogur entero o endulzado. Quesos o queso azul con alto contenido de grasas. [place] no lcteas y coberturas batidas. Quesos procesados, quesos para untar o cuajadas. Condimentos Sal de cebolla y ajo, sal condimentada, sal de mesa y sal marina. Salsas en lata y envasadas. Salsa Worcester[place]. Salsa trtara. Salsa barbacoa. Salsa teriyaki. Salsa de soja, incluso la que tiene contenido reducido de sodio. Salsa de carne. Salsa de pescado. Salsa de ostras. Salsa rosada. Rbano picante. Ketchup y mostaza. Saborizantes y tiernizantes para carne. Caldo en cubitos. Salsa picante. Salsa tabasco. Adobos. Aderezos para tacos. Salsas. Grasas y aceites [place], margarina en barra, manteca de cerdo, grasa, mantequilla clarificada y grasa de tocino. Aceites de coco, de palmiste o de palma. Aderezos comunes para ensalada. Otros Pickles y aceitunas. Palomitas de maz y pretzels con sal. Los artculos mencionados arriba pueden no ser una lista completa de las bebidas y los alimentos que se deben evitar. Comunquese con el  nutricionista para obtener ms informacin. DNDE PUEDO ENCONTRAR MS INFORMACIN? [facility] [place], del Pulmn y de la Sangre (National Heart, Lung, and Blood Institute): www.nhlbi.nih.gov/health/health-topics/topics/dash/ Document Released: 04/24/2011 Document Revised: 09/19/2013 ExitCare Patient Information 2015 [employer]. This information is not intended to replace advice given to you by your health care provider. Make sure you discuss any questions you have with your health care provider.  

## 2014-06-16 NOTE — Progress Notes (Signed)
Patient here for follow up on her diabetes And medication refills

## 2014-06-16 NOTE — Progress Notes (Signed)
MRN: [id number] Name: Alyssa Garrison  Sex: female Age: 39 y.o. DOB: 1975/10/01  Allergies: Review of patient's allergies indicates no known allergies.  Chief Complaint  Patient presents with  . Follow-up    HPI: Patient is 39 y.o. female who has history of diabetes hypertension comes today for followup, heart block pressure is borderline elevated as per patient she has not taken her blood pressure medication today, on the last visit she was switched to [place] and continued with her Glucotrol, today noticed her hemoglobin A1c has trended down, she denies any hypoglycemic symptoms. Her patient would like to get a flu shot today.   Past Medical History  Diagnosis Date  . Diabetes mellitus without complication     History reviewed. No pertinent past surgical history.    Medication List       This list is accurate as of: 06/16/14 12:21 PM.  Always use your most recent med list.               aspirin 81 MG tablet  Take 1 tablet (81 mg total) by mouth daily.     freestyle lancets  Use as instructed     glipiZIDE 10 MG tablet  Commonly known as:  GLUCOTROL  Take 2 tablets (20 mg total) by mouth 2 (two) times daily before a meal.     glucose blood test strip  Use as instructed     glucose monitoring kit monitoring kit  1 each by Does not apply route as needed for other.     ramipril 2.5 MG capsule  Commonly known as:  ALTACE  Take 1 capsule (2.5 mg total) by mouth daily.     sitaGLIPtin-metformin 50-1000 MG per tablet  Commonly known as:  JANUMET  Take 1 tablet by mouth 2 (two) times daily with a meal.     terbinafine 250 MG tablet  Commonly known as:  LAMISIL  Take 250 mg by mouth daily.        Meds ordered this encounter  Medications  . glipiZIDE (GLUCOTROL) 10 MG tablet    Sig: Take 2 tablets (20 mg total) by mouth 2 (two) times daily before a meal.    Dispense:  120 tablet    Refill:  3  . ramipril (ALTACE) 2.5 MG capsule    Sig: Take 1  capsule (2.5 mg total) by mouth daily.    Dispense:  90 capsule    Refill:  3  . sitaGLIPtin-metformin (JANUMET) 50-1000 MG per tablet    Sig: Take 1 tablet by mouth 2 (two) times daily with a meal.    Dispense:  60 tablet    Refill:  3    Immunization History  Administered Date(s) Administered  . Influenza Split 01/24/2013  . Influenza Whole 03/14/2008    Family History  Problem Relation Age of Onset  . Diabetes Mother   . Diabetes Paternal Grandmother     History  Substance Use Topics  . Smoking status: Never Smoker   . Smokeless tobacco: Not on file  . Alcohol Use: Not on file    Review of Systems   As noted in HPI  Filed Vitals:   06/16/14 1144  BP: 147/89  Pulse: 82  Temp: 98.6 F (37 C)  Resp: 16    Physical Exam  Physical Exam  Constitutional: No distress.  Eyes: EOM are normal. Pupils are equal, round, and reactive to light.  Cardiovascular: Normal rate.   Pulmonary/Chest: Breath sounds normal. No respiratory  distress. She has no wheezes. She has no rales.  Musculoskeletal: She exhibits no edema.    CBC    Component Value Date/Time   WBC 8.9 01/12/2013 1807   RBC 4.57 01/12/2013 1807   HGB 12.1 01/12/2013 1807   HCT 36.0 01/12/2013 1807   PLT 319 01/12/2013 1807   MCV 78.8 01/12/2013 1807   LYMPHSABS 2.8 01/12/2013 1807   MONOABS 0.6 01/12/2013 1807   EOSABS 0.1 01/12/2013 1807   BASOSABS 0.0 01/12/2013 1807    CMP     Component Value Date/Time   NA 137 03/08/2014 1032   K 4.2 03/08/2014 1032   CL 103 03/08/2014 1032   CO2 23 03/08/2014 1032   GLUCOSE 124* 03/08/2014 1032   BUN 10 03/08/2014 1032   CREATININE 0.57 03/08/2014 1032   CREATININE 0.51 03/08/2009 1005   CALCIUM 8.8 03/08/2014 1032   PROT 7.7 03/08/2014 1032   ALBUMIN 4.3 03/08/2014 1032   AST 18 03/08/2014 1032   ALT 14 03/08/2014 1032   ALKPHOS 60 03/08/2014 1032   BILITOT 0.6 03/08/2014 1032   GFRNONAA >89 03/08/2014 1032   GFRNONAA >60 03/08/2009 1005    GFRAA >89 03/08/2014 1032   GFRAA  03/08/2009 1005    >60        The eGFR has been calculated using the MDRD equation. This calculation has not been validated in all clinical situations. eGFR's persistently <60 mL/min signify possible Chronic Kidney Disease.    Lab Results  Component Value Date/Time   CHOL 166 03/08/2014 10:32 AM    No components found for: HGA1C  Lab Results  Component Value Date/Time   AST 18 03/08/2014 10:32 AM    Assessment and Plan  Other specified diabetes mellitus without complications - Plan:  Results for orders placed or performed in visit on 06/16/14  Glucose (CBG)  Result Value Ref Range   POC Glucose 177 (A) 70 - 99 mg/dl  HgB A1c  Result Value Ref Range   Hemoglobin A1C 6.90    , HgB A1c has trended down, she'll continue with current meds, continue with diabetes meal planning, will repeat A1c in 3 months  glipiZIDE (GLUCOTROL) 10 MG tablet, sitaGLIPtin-metformin (JANUMET) 50-1000 MG per tablet, COMPLETE METABOLIC PANEL WITH GFR  Essential hypertension, benign - Plan:Advised patient for DASH diet, continue with  ramipril (ALTACE) 2.5 MG capsule, COMPLETE METABOLIC PANEL WITH GFR  Needs flu shot Flu shot given today.  Health Maintenance  -Vaccinations:  -flu shot today   Return in about 3 months (around 09/15/2014).  Lorayne Marek, MD

## 2014-06-22 ENCOUNTER — Ambulatory Visit: Payer: Self-pay

## 2014-07-04 ENCOUNTER — Telehealth: Payer: Self-pay | Admitting: Internal Medicine

## 2014-07-04 ENCOUNTER — Other Ambulatory Visit: Payer: Self-pay

## 2014-07-04 NOTE — Telephone Encounter (Signed)
Patient has come in today to request a medication refill for sitaGLIPtin-metformin (JANUMET) 50-1000 MG per tablet; Patient has spoken with Langley Gauss and is in the lobby

## 2014-09-06 ENCOUNTER — Ambulatory Visit: Payer: Self-pay | Attending: Internal Medicine

## 2014-09-14 ENCOUNTER — Ambulatory Visit: Payer: Self-pay | Attending: Internal Medicine | Admitting: Internal Medicine

## 2014-09-14 ENCOUNTER — Encounter: Payer: Self-pay | Admitting: Internal Medicine

## 2014-09-14 VITALS — BP 117/75 | HR 75 | Temp 98.0°F | Resp 16 | Wt 152.8 lb

## 2014-09-14 DIAGNOSIS — E139 Other specified diabetes mellitus without complications: Secondary | ICD-10-CM

## 2014-09-14 DIAGNOSIS — I10 Essential (primary) hypertension: Secondary | ICD-10-CM | POA: Insufficient documentation

## 2014-09-14 DIAGNOSIS — E119 Type 2 diabetes mellitus without complications: Secondary | ICD-10-CM | POA: Insufficient documentation

## 2014-09-14 DIAGNOSIS — Z7982 Long term (current) use of aspirin: Secondary | ICD-10-CM | POA: Insufficient documentation

## 2014-09-14 LAB — GLUCOSE, POCT (MANUAL RESULT ENTRY): POC Glucose: 117 mg/dl — AB (ref 70–99)

## 2014-09-14 LAB — POCT GLYCOSYLATED HEMOGLOBIN (HGB A1C): Hemoglobin A1C: 6.7

## 2014-09-14 MED ORDER — GLIPIZIDE 10 MG PO TABS: 20.0000 mg | ORAL_TABLET | Freq: Two times a day (BID) | ORAL | Status: DC

## 2014-09-14 MED ORDER — RAMIPRIL 2.5 MG PO CAPS: 2.5000 mg | ORAL_CAPSULE | Freq: Every day | ORAL | Status: DC

## 2014-09-14 MED ORDER — SITAGLIPTIN PHOS-METFORMIN HCL 50-1000 MG PO TABS: 1.0000 | ORAL_TABLET | Freq: Two times a day (BID) | ORAL | Status: DC

## 2014-09-14 NOTE — Progress Notes (Signed)
Patient here for her three month follow up on her diabetes Patient also requesting refills on her medications

## 2014-09-14 NOTE — Progress Notes (Signed)
MRN: [id number] Name: Alyssa Garrison  Sex: female Age: 39 y.o. DOB: 1976-04-08  Allergies: Review of patient's allergies indicates no known allergies.  Chief Complaint  Patient presents with  . Follow-up    HPI: Patient is 39 y.o. female who history of hypertension number diabetes has patient she is compliant in taking her medications, she denies any hypoglycemic symptoms, her hemoglobin A1c has trended down, today she is requesting refill on her medications, patient has already eaten today. Patient denies any acute symptoms, denies any headache dizziness chest and shortness of breath.  Past Medical History  Diagnosis Date  . Diabetes mellitus without complication     History reviewed. No pertinent past surgical history.    Medication List       This list is accurate as of: 09/14/14 11:36 AM.  Always use your most recent med list.               aspirin 81 MG tablet  Take 1 tablet (81 mg total) by mouth daily.     freestyle lancets  Use as instructed     glipiZIDE 10 MG tablet  Commonly known as:  GLUCOTROL  Take 2 tablets (20 mg total) by mouth 2 (two) times daily before a meal.     glucose blood test strip  Use as instructed     glucose monitoring kit monitoring kit  1 each by Does not apply route as needed for other.     ramipril 2.5 MG capsule  Commonly known as:  ALTACE  Take 1 capsule (2.5 mg total) by mouth daily.     sitaGLIPtin-metformin 50-1000 MG per tablet  Commonly known as:  JANUMET  Take 1 tablet by mouth 2 (two) times daily with a meal.     terbinafine 250 MG tablet  Commonly known as:  LAMISIL  Take 250 mg by mouth daily.        Meds ordered this encounter  Medications  . glipiZIDE (GLUCOTROL) 10 MG tablet    Sig: Take 2 tablets (20 mg total) by mouth 2 (two) times daily before a meal.    Dispense:  120 tablet    Refill:  3  . ramipril (ALTACE) 2.5 MG capsule    Sig: Take 1 capsule (2.5 mg total) by mouth daily.   Dispense:  90 capsule    Refill:  3  . sitaGLIPtin-metformin (JANUMET) 50-1000 MG per tablet    Sig: Take 1 tablet by mouth 2 (two) times daily with a meal.    Dispense:  60 tablet    Refill:  3    Immunization History  Administered Date(s) Administered  . Influenza Split 01/24/2013  . Influenza Whole 03/14/2008  . Influenza,inj,Quad PF,36+ Mos 06/16/2014    Family History  Problem Relation Age of Onset  . Diabetes Mother   . Diabetes Paternal Grandmother     History  Substance Use Topics  . Smoking status: Never Smoker   . Smokeless tobacco: Not on file  . Alcohol Use: Not on file    Review of Systems   As noted in HPI  Filed Vitals:   09/14/14 1100  BP: 117/75  Pulse: 75  Temp: 98 F (36.7 C)  Resp: 16    Physical Exam  Physical Exam  Constitutional: No distress.  Eyes: EOM are normal. Pupils are equal, round, and reactive to light.  Cardiovascular: Normal rate and regular rhythm.   Pulmonary/Chest: Breath sounds normal. No respiratory distress. She has no wheezes. She  has no rales.    CBC    Component Value Date/Time   WBC 8.9 01/12/2013 1807   RBC 4.57 01/12/2013 1807   HGB 12.1 01/12/2013 1807   HCT 36.0 01/12/2013 1807   PLT 319 01/12/2013 1807   MCV 78.8 01/12/2013 1807   LYMPHSABS 2.8 01/12/2013 1807   MONOABS 0.6 01/12/2013 1807   EOSABS 0.1 01/12/2013 1807   BASOSABS 0.0 01/12/2013 1807    CMP     Component Value Date/Time   NA 137 06/16/2014 1228   K 4.8 06/16/2014 1228   CL 101 06/16/2014 1228   CO2 24 06/16/2014 1228   GLUCOSE 182* 06/16/2014 1228   BUN 10 06/16/2014 1228   CREATININE 0.61 06/16/2014 1228   CREATININE 0.51 03/08/2009 1005   CALCIUM 9.6 06/16/2014 1228   PROT 8.1 06/16/2014 1228   ALBUMIN 4.3 06/16/2014 1228   AST 19 06/16/2014 1228   ALT 14 06/16/2014 1228   ALKPHOS 57 06/16/2014 1228   BILITOT 0.5 06/16/2014 1228   GFRNONAA >89 06/16/2014 1228   GFRNONAA >60 03/08/2009 1005   GFRAA >89 06/16/2014 1228    GFRAA  03/08/2009 1005    >60        The eGFR has been calculated using the MDRD equation. This calculation has not been validated in all clinical situations. eGFR's persistently <60 mL/min signify possible Chronic Kidney Disease.    Lab Results  Component Value Date/Time   CHOL 166 03/08/2014 10:32 AM    Lab Results  Component Value Date/Time   HGBA1C 6.70 09/14/2014 10:54 AM   HGBA1C 7.1* 09/18/2008 10:46 PM    Lab Results  Component Value Date/Time   AST 19 06/16/2014 12:28 PM    Assessment and Plan  Other specified diabetes mellitus without complications - Plan:  Results for orders placed or performed in visit on 09/14/14  Glucose (CBG)  Result Value Ref Range   POC Glucose 117.0 (A) 70 - 99 mg/dl  HgB A1c  Result Value Ref Range   Hemoglobin A1C 6.70    Hemoglobin A1c has trended down, continue with current meds,, glipiZIDE (GLUCOTROL) 10 MG tablet, sitaGLIPtin-metformin (JANUMET) 50-1000 MG per tablet  Essential hypertension, benign - Plan: blood pressure is controlled, continue with ramipril (ALTACE) 2.5 MG capsule  Recheck fasting blood chemistry as well as lipid panel on the following visit.    Return in about 3 months (around 12/14/2014), or if symptoms worsen or fail to improve, for diabetes.   This note has been created with Surveyor, quantity. Any transcriptional errors are unintentional.    Lorayne Marek, MD

## 2014-09-14 NOTE — Patient Instructions (Signed)
La diabetes mellitus y los alimentos (Diabetes Mellitus and Food) Es importante que controle su nivel de azcar en la sangre (glucosa). El nivel de glucosa en sangre depende en gran medida de lo que usted come. [place] alimentos saludables en las cantidades [place] a lo largo del Training and development officer, aproximadamente a la misma hora [employer], lo ayudar a Chief Technology Officer su nivel de Multimedia programmer. Tambin puede ayudarlo a retrasar o Patent attorney de la diabetes mellitus. [place]er de [employer] saludable incluso puede ayudarlo a Chartered loss adjuster de presin arterial y a Science writer o Theatre manager un peso saludable.  CMO PUEDEN AFECTARME LOS ALIMENTOS? Carbohidratos Los carbohidratos afectan el nivel de glucosa en sangre ms que cualquier otro tipo de alimento. El nutricionista lo ayudar a Teacher, adult education cuntos carbohidratos puede consumir en cada comida y ensearle a contarlos. El recuento de carbohidratos es importante para mantener la glucosa en sangre en un nivel saludable, en especial si utiliza insulina o toma determinados medicamentos para la diabetes mellitus. Alcohol El alcohol puede provocar disminuciones sbitas de la glucosa en sangre (hipoglucemia), en especial si utiliza insulina o toma determinados medicamentos para la diabetes mellitus. La hipoglucemia es una afeccin que puede poner en peligro la vida. Los sntomas de la hipoglucemia (somnolencia, mareos y Data processing manager) son similares a los sntomas de haber consumido mucho alcohol.  Si el mdico lo autoriza a beber alcohol, hgalo con moderacin y siga estas pautas:  Las mujeres no deben beber ms de un trago por da, y los hombres no deben beber ms de dos tragos por Training and development officer. Un trago es igual a:  12 onzas (355 ml) de cerveza  5 onzas de vino (150 ml) de vino  1,5onzas (35ml) de bebidas espirituosas  No beba con el estmago vaco.  Mantngase hidratado. Beba agua, gaseosas dietticas o t helado sin azcar.  Las gaseosas comunes, los jugos y  otros refrescos podran contener muchos carbohidratos y se Civil Service fast streamer. QU ALIMENTOS NO SE RECOMIENDAN? Cuando haga las elecciones de alimentos, es importante que recuerde que todos los alimentos son distintos. Algunos tienen menos nutrientes que otros por porcin, aunque podran tener la misma cantidad de caloras o carbohidratos. Es difcil darle al cuerpo lo que necesita cuando consume alimentos con menos nutrientes. Estos son algunos ejemplos de alimentos que debera evitar ya que contienen muchas caloras y carbohidratos, pero pocos nutrientes:  Physicist, medical trans (la mayora de los alimentos procesados incluyen grasas trans en la etiqueta de Informacin nutricional).  Gaseosas comunes.  Jugos.  Caramelos.  Dulces, como tortas, pasteles, rosquillas y [place].  Comidas fritas. QU ALIMENTOS PUEDO COMER? Consuma alimentos ricos en nutrientes, que nutrirn el cuerpo y lo mantendrn saludable. Los alimentos que debe comer tambin dependern de varios factores, como:  Las caloras que necesita.  Los medicamentos que toma.  Su peso.  El nivel de glucosa en [place].  El [place] de presin arterial.  El nivel de colesterol. Tambin debe consumir una variedad de [place], como:  Protenas, como carne, aves, pescado, tofu, frutos secos y semillas (las protenas de [place] magros son mejores).  Lambert Mody.  Verduras.  Productos lcteos, como [place], queso y yogur (descremados son mejores).  Panes, granos, pastas, cereales, arroz y frijoles.  Grasas, como aceite de [place], [place] sin grasas trans, aceite de canola, aguacate y [place]. TODOS LOS QUE PADECEN DIABETES MELLITUS TIENEN EL [place] PLAN DE [place]? Dado que todas las personas que padecen diabetes mellitus son distintas, no hay un solo plan de comidas que funcione para todos. Es muy  importante que se rena con un nutricionista que lo ayudar a crear un plan de comidas adecuado para usted. Document Released: 08/12/2007  Document Revised: 05/10/2013 [facility] Patient Information 2015 [place]. This information is not intended to replace advice given to you by your health care provider. Make sure you discuss any questions you have with your health care provider.

## 2014-12-31 DIAGNOSIS — R809 Proteinuria, unspecified: Secondary | ICD-10-CM | POA: Insufficient documentation

## 2015-01-02 ENCOUNTER — Ambulatory Visit: Payer: Self-pay | Attending: Internal Medicine

## 2015-01-11 ENCOUNTER — Encounter: Payer: Self-pay | Admitting: Internal Medicine

## 2015-01-11 ENCOUNTER — Ambulatory Visit: Payer: Self-pay | Attending: Internal Medicine | Admitting: Internal Medicine

## 2015-01-11 VITALS — BP 128/82 | HR 60 | Temp 98.2°F | Resp 16 | Wt 148.8 lb

## 2015-01-11 DIAGNOSIS — E119 Type 2 diabetes mellitus without complications: Secondary | ICD-10-CM

## 2015-01-11 DIAGNOSIS — I10 Essential (primary) hypertension: Secondary | ICD-10-CM

## 2015-01-11 LAB — CBC WITH DIFFERENTIAL/PLATELET
BASOS ABS: 0 10*3/uL (ref 0.0–0.1)
Basophils Relative: 0 % (ref 0–1)
Eosinophils Absolute: 0.1 10*3/uL (ref 0.0–0.7)
Eosinophils Relative: 2 % (ref 0–5)
HEMATOCRIT: 37 % (ref 36.0–46.0)
HEMOGLOBIN: 12.1 g/dL (ref 12.0–15.0)
LYMPHS PCT: 33 % (ref 12–46)
Lymphs Abs: 1.9 10*3/uL (ref 0.7–4.0)
MCH: 27.7 pg (ref 26.0–34.0)
MCHC: 32.7 g/dL (ref 30.0–36.0)
MCV: 84.7 fL (ref 78.0–100.0)
MPV: 10.9 fL (ref 8.6–12.4)
Monocytes Absolute: 0.5 10*3/uL (ref 0.1–1.0)
Monocytes Relative: 8 % (ref 3–12)
NEUTROS ABS: 3.3 10*3/uL (ref 1.7–7.7)
Neutrophils Relative %: 57 % (ref 43–77)
Platelets: 336 10*3/uL (ref 150–400)
RBC: 4.37 MIL/uL (ref 3.87–5.11)
RDW: 14.7 % (ref 11.5–15.5)
WBC: 5.8 10*3/uL (ref 4.0–10.5)

## 2015-01-11 LAB — TSH: TSH: 2.603 u[IU]/mL (ref 0.350–4.500)

## 2015-01-11 LAB — POCT GLYCOSYLATED HEMOGLOBIN (HGB A1C): HEMOGLOBIN A1C: 6.4

## 2015-01-11 LAB — GLUCOSE, POCT (MANUAL RESULT ENTRY): POC Glucose: 123 mg/dl — AB (ref 70–99)

## 2015-01-11 MED ORDER — RAMIPRIL 2.5 MG PO CAPS: 2.5000 mg | ORAL_CAPSULE | Freq: Every day | ORAL | Status: DC

## 2015-01-11 MED ORDER — SITAGLIPTIN PHOS-METFORMIN HCL 50-1000 MG PO TABS: 1.0000 | ORAL_TABLET | Freq: Two times a day (BID) | ORAL | Status: DC

## 2015-01-11 MED ORDER — GLIPIZIDE 10 MG PO TABS: 20.0000 mg | ORAL_TABLET | Freq: Two times a day (BID) | ORAL | Status: DC

## 2015-01-11 NOTE — Progress Notes (Signed)
Patient ID: Alyssa Garrison, female   DOB: 15-Sep-1975, 39 y.o.   MRN: [id number]   Alyssa Garrison, is a 39 y.o. female  CBS:[id number]  WGY:[id number]  DOB - 05-25-1975  Chief Complaint  Patient presents with  . Diabetes        Subjective:   Alyssa Garrison is a 39 y.o. female here today for a follow up visit of diabetes and hypertension. She is a very pleasant woman with mid medical history significant for hypertension well controlled and diabetes well controlled on medications as listed below. Patient is compliant with medications. She has no complaint today. She reports no side effects to medications. She denies any hypoglycemic event. She does not smoke cigarettes, she does not drink alcohol. Patient has No headache, No chest pain, No abdominal pain - No Nausea, No new weakness tingling or numbness, No Cough - SOB.  No problems updated.  ALLERGIES: No Known Allergies  PAST MEDICAL HISTORY: Past Medical History  Diagnosis Date  . Diabetes mellitus without complication     MEDICATIONS AT HOME: Prior to Admission medications   Medication Sig Start Date End Date Taking? Authorizing Provider  aspirin 81 MG tablet Take 1 tablet (81 mg total) by mouth daily. 04/28/13  Yes Haille Pardi Essie Christine, MD  glipiZIDE (GLUCOTROL) 10 MG tablet Take 2 tablets (20 mg total) by mouth 2 (two) times daily before a meal. 01/11/15  Yes Creedence Heiss E Olive Motyka, MD  glucose blood test strip Use as instructed 06/02/13  Yes Monetta Lick E Doreene Burke, MD  glucose monitoring kit (FREESTYLE) monitoring kit 1 each by Does not apply route as needed for other. 06/02/13  Yes Tresa Garter, MD  Lancets (FREESTYLE) lancets Use as instructed 06/02/13  Yes Sister Carbone Essie Christine, MD  ramipril (ALTACE) 2.5 MG capsule Take 1 capsule (2.5 mg total) by mouth daily. 01/11/15  Yes Tresa Garter, MD  sitaGLIPtin-metformin (JANUMET) 50-1000 MG per tablet Take 1 tablet by mouth 2 (two) times daily with a meal.  01/11/15  Yes Christiona Siddique E Doreene Burke, MD  terbinafine (LAMISIL) 250 MG tablet Take 250 mg by mouth daily.      Historical Provider, MD     Objective:   Filed Vitals:   01/11/15 1017  BP: 128/82  Pulse: 60  Temp: 98.2 F (36.8 C)  TempSrc: Oral  Resp: 16  Weight: 148 lb 12.8 oz (67.495 kg)  SpO2: 99%    Exam General appearance : Awake, alert, not in any distress. Speech Clear. Not toxic looking HEENT: Atraumatic and Normocephalic, pupils equally reactive to light and accomodation Neck: supple, no JVD. No cervical lymphadenopathy.  Chest:Good air entry bilaterally, no added sounds  CVS: S1 S2 regular, no murmurs.  Abdomen: Bowel sounds present, Non tender and not distended with no gaurding, rigidity or rebound. Extremities: B/L Lower Ext shows no edema, both legs are warm to touch Neurology: Awake alert, and oriented X 3, CN II-XII intact, Non focal Skin:No Rash  Data Review Lab Results  Component Value Date   HGBA1C 6.40 01/11/2015   HGBA1C 6.70 09/14/2014   HGBA1C 6.90 06/16/2014     Assessment & Plan   1. Type 2 diabetes mellitus without complications  - Glucose (CBG) - POCT A1C - sitaGLIPtin-metformin (JANUMET) 50-1000 MG per tablet; Take 1 tablet by mouth 2 (two) times daily with a meal.  Dispense: 180 tablet; Refill: 3 - glipiZIDE (GLUCOTROL) 10 MG tablet; Take 2 tablets (20 mg total) by mouth 2 (two) times daily before a meal.  Dispense: 180  tablet; Refill: 3 - Microalbumin, urine - Microalbumin / creatinine urine ratio  Aim for 30 minutes of exercise most days. Rethink what you drink. Water is great! Aim for 2-3 Carb Choices per meal (30-45 grams) +/- 1 either way  Aim for 0-15 Carbs per snack if hungry  Include protein in moderation with your meals and snacks  Consider reading food labels for Total Carbohydrate and Fat Grams of foods  Consider checking BG at alternate times per day  Continue taking medication as directed Be mindful about how much sugar  you are adding to beverages and other foods. Fruit Punch - find one with no sugar  Measure and decrease portions of carbohydrate foods  Make your plate and don't go back for seconds  2. Essential hypertension, benign  - ramipril (ALTACE) 2.5 MG capsule; Take 1 capsule (2.5 mg total) by mouth daily.  Dispense: 90 capsule; Refill: 3 - CBC with Differential/Platelet - COMPLETE METABOLIC PANEL WITH GFR - TSH  We have discussed target BP range and blood pressure goal. I have advised patient to check BP regularly and to call us back or report to clinic if the numbers are consistently higher than 140/90. We discussed the importance of compliance with medical therapy and DASH diet recommended, consequences of uncontrolled hypertension discussed.  - continue current BP medications  Patient have been counseled extensively about nutrition and exercise  Return in about 3 months (around 04/13/2015), or if symptoms worsen or fail to improve, for Hemoglobin A1C and Follow up, DM, Follow up HTN.  The patient was given clear instructions to go to ER or return to medical center if symptoms don't improve, worsen or new problems develop. The patient verbalized understanding. The patient was told to call to get lab results if they haven't heard anything in the next week.   This note has been created with Surveyor, quantity. Any transcriptional errors are unintentional.    Angelica Chessman, MD, [place], Karilyn Cota, [place] [facility] [place], [place]   01/11/2015, 10:57 AM

## 2015-01-11 NOTE — Patient Instructions (Signed)
Plan de alimentacin DASH (DASH Eating Plan) DASH es la sigla en ingls de "Enfoques Alimentarios para Detener la Hipertensin". El plan de alimentacin DASH ha demostrado bajar la presin arterial elevada (hipertensin). Los beneficios adicionales para la salud pueden incluir la disminucin del riesgo de diabetes mellitus tipo2, enfermedades cardacas e ictus. Este plan tambin puede ayudar a Horticulturist, commercial. QU DEBO SABER ACERCA DEL PLAN DE ALIMENTACIN DASH? Para el plan de alimentacin DASH, seguir las siguientes pautas generales:  Elija los alimentos con un valor porcentual diario de sodio de menos del 5% (segn figura en la etiqueta del alimento).  Use hierbas o aderezos sin sal, en lugar de sal de mesa o sal marina.  Consulte al mdico o farmacutico antes de usar sustitutos de la sal.  Coma productos con bajo contenido de sodio, cuya etiqueta suele decir "bajo contenido de sodio" o "sin agregado de sal".  Coma alimentos frescos.  Coma ms verduras, frutas y productos lcteos con bajo contenido de [place].  Elija los cereales integrales. Busque la palabra "integral" en Equities trader de la lista de ingredientes.  Elija el pescado y el pollo o el pavo sin piel ms a menudo que las carnes rojas. Limite el consumo de pescado, carne de ave y carne a 6onzas (170g) por Training and development officer.  Limite el consumo de dulces, postres, azcares y bebidas azucaradas.  Elija las grasas saludables para el corazn.  Limite el consumo de queso a 1onza (28g) por Training and development officer.  Consuma ms comida casera y menos de restaurante, de buf y comida rpida.  Limite el consumo de alimentos fritos.  Cocine los alimentos utilizando mtodos que no sean la fritura.  Limite las verduras enlatadas. Si las consume, enjuguelas bien para disminuir el sodio.  Cuando coma en un restaurante, pida que preparen su comida con menos sal o, en lo posible, sin nada de sal. QU ALIMENTOS PUEDO COMER? Pida ayuda a un nutricionista para  conocer las necesidades calricas individuales. Cereales Pan de salvado o integral. Arroz integral. Pastas de salvado o integrales. Quinua, trigo burgol y cereales integrales. Cereales con bajo contenido de sodio. Tortillas de harina de maz o de salvado. Pan de maz integral. Galletas saladas integrales. Galletas con bajo contenido de [place]. Vegetales Verduras frescas o congeladas (crudas, al vapor, asadas o grilladas). Jugos de tomate y verduras con contenido bajo o reducido de sodio. Pasta y salsa de tomate con contenido bajo o [place]. Verduras enlatadas con bajo contenido de sodio o reducido de sodio.  Lambert Mody Lambert Mody frescas, en conserva (en su jugo natural) o frutas congeladas. Carnes y otros productos con protenas Carne de res molida (al 85% o ms [place]), carne de res de animales alimentados con pastos o carne de res sin la grasa. Pollo o pavo sin piel. Carne de pollo o de [place]. Cerdo sin la grasa. Todos los pescados y frutos de mar. Huevos. Porotos, guisantes o lentejas secos. Frutos secos y semillas sin sal. Frijoles enlatados sin sal. Lcteos Productos lcteos con bajo contenido de grasas, como [place] o al 1%, quesos reducidos en grasas o al 2%, ricota con bajo contenido de grasas o [employer], o yogur natural con bajo contenido de [place]. Quesos con contenido bajo o reducido de sodio. Grasas y Naval architect en barra que no contengan grasas trans. [place]ones[place] y alios para ensaladas livianos o reducidos en grasas (reducidos en sodio). [place]. Aceites de crtamo, oliva o canola. [place] natural de man o almendra. Otros Palomitas de maz y pretzels sin sal.  Los artculos mencionados arriba pueden no ser [employer] Foods Company de las bebidas o los alimentos recomendados. Comunquese con el nutricionista para conocer ms opciones. QU ALIMENTOS NO SE RECOMIENDAN? Cereales Pan blanco. Pastas blancas. Arroz blanco. Pan de maz refinado. Bagels y  croissants. Galletas saladas que contengan grasas trans. Vegetales Vegetales con crema o fritos. Verduras en [place]. Verduras enlatadas comunes. Pasta y salsa de tomate en lata comunes. Jugos comunes de tomate y de verduras. Lambert Mody Frutas secas. Fruta enlatada en almbar liviano o espeso. Jugo de frutas. Carnes y otros productos con protenas [place] de carne con Lobbyist. Costillas, alas de pollo, tocineta, salchicha, mortadela, salame, chinchulines, tocino, perros calientes, salchichas alemanas y embutidos envasados. Frutos secos y semillas con sal. Frijoles con sal en lata. Lcteos Leche entera o al 2%, crema, mezcla de [place] y crema, y queso crema. Yogur entero o endulzado. Quesos o queso azul con alto contenido de Physicist, medical. [place]rem[place] no lcteas y coberturas batidas. Quesos procesados, quesos para untar o cuajadas. Condimentos Sal de cebolla y ajo, sal condimentada, sal de mesa y sal marina. Salsas en lata y envasadas. Salsa [place]. Salsa trtara. Salsa barbacoa. Salsa teriyaki. Salsa de soja, incluso la que tiene contenido reducido de [place]. Salsa de carne. Salsa de pescado. Salsa de [place]. Salsa rosada. Rbano picante. Ketchup y mostaza. Saborizantes y tiernizantes para carne. Caldo en cubitos. Salsa picante. Salsa tabasco. Adobos. Aderezos para tacos. Salsas. Grasas y aceites [place], [place] en barra, [place] de [place], [place], [place] clarificada y Wendee Copp de tocino. Aceites de coco, de palmiste o de palma. Aderezos comunes para ensalada. Otros Pickles y [place]. Palomitas de maz y pretzels con sal. Los artculos mencionados arriba pueden no ser [employer] Foods Company de las bebidas y los alimentos que se Higher education careers adviser. Comunquese con el nutricionista para obtener ms informacin. DNDE Dolan Amen MS INFORMACIN? [place], del Pulmn y de la Sangre (National Heart, Lung, and [place]):  [email or web address] Document Released: 04/24/2011 Document Revised: 09/19/2013 [facility] Patient Information 2015 [place], [place]. This information is not intended to replace advice given to you by your health care provider. Make sure you discuss any questions you have with your health care provider. Hipertensin (Hypertension) La hipertensin, conocida comnmente como presin arterial alta, se produce cuando la sangre bombea en las arterias con mucha fuerza. Las arterias son los vasos sanguneos que transportan la sangre desde el corazn hacia todas las partes del cuerpo. Una lectura de la presin arterial consiste en un nmero ms alto sobre un nmero ms bajo, por ejemplo, 110/72. El nmero ms alto (presin sistlica) corresponde a la presin interna de las arterias cuando el corazn [place]. El nmero ms bajo (presin diastlica) corresponde a la presin interna de las arterias cuando el corazn se relaja. En condiciones ideales, la presin arterial debe ser inferior a 120/80. La hipertensin fuerza al corazn a trabajar ms para Herbalist. Las arterias pueden estrecharse o ponerse rgidas. La hipertensin conlleva el riesgo de enfermedad cardaca, ictus y otros problemas.  [place] de riesgo de hipertensin son controlables, pero otros no lo son.  [employer] factores de riesgo que usted no puede Chief Technology Officer, se incluyen:   Manufacturing systems engineer. El riesgo es mayor para las Retail banker.  La edad. Los riesgos aumentan con la edad.  El sexo. Antes de los 45aos, los hombres corren ms [employer]. Despus de los 65aos, las mujeres corren ms [employer]M [employer]. NiSource factores de [place]  que usted IT consultant, se incluyen:  No hacer la cantidad suficiente de actividad fsica o ejercicio.  Tener sobrepeso.  Consumir mucha grasa, azcar, caloras o sal en la dieta.  Beber alcohol en exceso. SIGNOS Y  SNTOMAS Por lo general, la hipertensin no causa signos o sntomas. La hipertensin demasiado alta (crisis hipertensiva) puede causar dolor de cabeza, ansiedad, falta de aire y hemorragia nasal. DIAGNSTICO  Para detectar si usted tiene hipertensin, el mdico le medir la presin arterial mientras est sentado, con el brazo levantado a la altura del corazn. Debe medirla al [facility] veces en el mismo brazo. Determinadas condiciones pueden causar una diferencia de presin arterial entre el brazo izquierdo y Insurance underwriter. El hecho de tener una sola lectura de la presin arterial ms alta que lo normal no significa que Stage manager. En el caso de tener una lectura de la presin arterial con un valor alto, pdale al mdico que la verifique nuevamente. Lluveras hipertensin arterial incluye hacer cambios en el estilo de vida y, posiblemente, tomar medicamentos. Un estilo de vida saludable puede ayudar a bajar la presin arterial alta. Quiz deba cambiar algunos hbitos. Los Levi Strauss en el estilo de vida pueden incluir:  Seguir la dieta DASH. Esta dieta tiene un alto contenido de frutas, verduras y Psychologist, prison and probation services. Incluye poca cantidad de sal, carnes rojas y azcares agregados.  Hacer al menos 2horas de actividad fsica enrgica todas las semanas.  Perder peso, si es necesario.  No fumar.  Limitar el consumo de bebidas alcohlicas.  Aprender formas de reducir el estrs. Si los cambios en el estilo de vida no son suficientes para Child psychotherapist la presin arterial, el mdico puede recetarle medicamentos. Quiz necesite tomar ms de uno. Trabaje en conjunto con su mdico para comprender los riesgos y los beneficios. INSTRUCCIONES PARA EL CUIDADO EN EL HOGAR  Haga que le midan de nuevo la presin arterial segn las indicaciones del [place] los medicamentos solamente como se lo haya indicado el mdico. Siga cuidadosamente las indicaciones. Los  medicamentos para la presin arterial deben tomarse segn las indicaciones. Los medicamentos pierden eficacia al omitir las dosis. El hecho de omitir las dosis tambin [place] el riesgo de otros problemas.  No fume.  Contrlese la presin arterial en su casa segn las indicaciones del mdico. SOLICITE ATENCIN MDICA SI:   Piensa que tiene una reaccin alrgica a los medicamentos.  Tiene mareos o dolores de cabeza con Scientist, research (physical sciences).  Tiene hinchazn en los tobillos.  Tiene problemas de visin. SOLICITE ATENCIN MDICA DE INMEDIATO SI:  Siente un dolor de cabeza intenso o confusin.  Siente debilidad inusual, adormecimiento o que Geneticist, molecular.  Siente dolor intenso en el pecho o en el abdomen.  Vomita repetidas veces.  Tiene dificultad para respirar. ASEGRESE DE QUE:   Comprende estas instrucciones.  Controlar su afeccin.  Recibir ayuda de inmediato si no mejora o si empeora. Document Released: 05/05/2005 Document Revised: 09/19/2013 [facility] Patient Information 2015 [place], [place]. This information is not intended to replace advice given to you by your health care provider. Make sure you discuss any questions you have with your health care provider. Recuento bsico de carbohidratos para la diabetes mellitus (Basic Carbohydrate Counting for Diabetes Mellitus) El recuento de carbohidratos es un mtodo destinado a calcular la cantidad de carbohidratos en la dieta. El consumo de carbohidratos aumenta naturalmente el nivel de azcar (glucosa) en la sangre, por lo que es importante que sepa la cantidad que  debe incluir en cada comida. El recuento de carbohidratos ayuda a Advertising account executive de glucosa en la sangre dentro de los lmites normales. La cantidad permitida de carbohidratos es diferente para cada persona. Un nutricionista puede ayudarlo a calcular la cantidad adecuada para usted. Una vez que sepa la cantidad de carbohidratos que puede consumir, podr calcular los  carbohidratos de los alimentos que desea comer. Los siguientes alimentos incluyen carbohidratos:  Granos, como panes y cereales.  Frijoles secos y productos con soja.  Vegetales almidonados, como papas, guisantes y maz.  Lambert Mody y jugos de frutas.  Leche y Estate agent.  Dulces y bocadillos, como pastel, galletas, caramelos, papas fritas de bolsa, refrescos y bebidas frutales con azcar. RECUENTO DE CARBOHIDRATOS Micron Technology de calcular los carbohidratos de los alimentos. Puede usar cualquiera de los dos mtodos o [place] combinacin de [place]. Leer la etiqueta de informacin nutricional de los alimentos envasados La informacin nutricional es una etiqueta incluida en casi todas las bebidas y los alimentos envasados de los Blue. Indica el tamao de la porcin de ese alimento o bebida e informacin sobre los nutrientes de cada porcin, incluso los gramos (g) de carbohidratos por porcin.  Decida la cantidad de porciones que comer o tomar de este alimento o bebida. Multiplique la cantidad de porciones por el nmero de gramos de carbohidratos indicados en la etiqueta para esa porcin. El total ser la cantidad de carbohidratos que consumir al comer ese alimento o tomar esa bebida. Conocer las porciones estndar de los alimentos Cuando coma alimentos no envasados o que no incluyan la informacin nutricional en la etiqueta, deber medir las porciones para poder calcular la cantidad de carbohidratos. Una porcin de la mayora de los alimentos ricos en carbohidratos contiene alrededor de 15g de carbohidratos. La siguiente Valero Energy tamaos de porcin de los alimentos ricos en carbohidratos que contienen alrededor de 15g de carbohidratos por porcin:   1rebanada de pan (1oz) o 1tortilla de seis pulgadas.  panecillo de hamburguesa o bollito tipo ingls.  4a 6galletas.   de taza de cereal sin azcar y seco.   taza de cereal caliente.   de taza de arroz o  pastas.  taza de pur de papas o de una papa grande al horno.  1taza de frutas frescas o una fruta pequea.  taza de frutas o jugo de frutas enlatados o congelados.  [place].   de taza de yogur descremado sin ningn agregado o de yogur endulzado con edulcorante artificial.  taza de vegetales almidonados, como guisantes, maz o papas, o de frijoles secos cocidos. Decida la cantidad de porciones Gaffer. Multiplique la cantidad de porciones por 15 (los gramos de carbohidratos en esa porcin). Por ejemplo, si come 2tazas de fresas, habr comido 2porciones y 30g de carbohidratos (2porciones x 15g = 30g). Webb y guisos, en las que se mezcla ms de un alimento, deber [employer] carbohidratos de cada alimento incluido. EJEMPLO DE RECUENTO DE CARBOHIDRATOS Ejemplo de cena  3 onzas de pechugas de pollo.   de taza de arroz integral.   taza de [place].  1 taza de fresas con crema batida sin azcar. Clculo de [place] 1: Identifique los alimentos que contienen carbohidratos:   Arroz.  Maz.  Leche.  Hughie Closs. [place] 2: Calcule el nmero de porciones que consumir de cada uno:   2 porciones de Occupational psychologist.  1 porcin de maz.  [place].  1 porcin de fresas. [place] 3: Multiplique cada una de esas porciones por 15g:   2 porciones de arroz x 15 g = 30 g.  1 porcin de maz x 15 g = 15 g.  1 porcin de leche x 15 g = 15 g.  1 porcin de fresas x 15 g = 15 g. [place] 4: Sume todas las cantidades para Armed forces logistics/support/administrative officer total de gramos de carbohidratos consumidos: 30 g + 15 g + 15 g + 15 g = 75 g. Document Released: 07/28/2011 Document Revised: 09/19/2013 [facility] Patient Information 2015 [place]. This information is not intended to replace advice given to you by your health care provider. Make sure you discuss any questions you have with your health care provider. Diabetes y Kandace Blitz  fsica (Diabetes and Exercise) Hacer actividad fsica con regularidad es muy importante. No se trata solo de [employer] Mutual [employer] Omaha. Tiene muchos otros beneficios, como por ejemplo:  Mejorar el estado fsico, la flexibilidad y la resistencia.  Aumenta la densidad sea.  Ayuda a Technical sales engineer.  Disminuye la Air traffic controller.  Aumenta la fuerza muscular.  Reduce el estrs y las tensiones.  Mejora el estado de salud general. Las personas diabticas que realizan actividad fsica tienen beneficios adicionales debido al ejercicio:  Reduce el apetito.  El organismo mejora el uso del azcar (glucosa) de la [place].  Ayuda a disminuir o Product/process development scientist.  Disminuye la presin arterial.  Ayuda a disminuir los lpidos en la sangre (colesterol y triglicridos).  El organismo mejora el uso de la insulina porque:  Aumenta la sensibilidad del organismo a la insulina.  Reduce las necesidades de insulina del organismo.  Disminuye el riesgo de enfermedad cardaca por la actividad fsica ya que  disminuye el colesterol y [employer] triglicridos.  Aumenta los niveles de colesterol bueno (como las lipoprotenas de alta densidad [HDL]) en el organismo.  Disminuye los niveles de glucosa en la [place]. SU PLAN DE ACTIVIDAD  Elija una actividad que disfrute y establezca objetivos realistas. Su mdico o educador en diabetes podrn ayudarlo a encontrar una actividad que lo beneficie. Haga ejercicio regularmente como se lo haya indicado el mdico. Esto incluye:  Hacer entrenamiento de [employer] a la semana, como flexiones, sentadillas, levantar peso o usar bandas de resistencia.  Practicar 172minutos de ejercicios cardiovasculares cada semana, como caminar, correr o hacer algn deporte.  Mantenerse activo y no permanecer inactivo durante ms de 79minutos seguidos. Los perodos cortos de [place] tambin son beneficiosos. Tres sesiones de 74minutos a lo largo del da son  tan beneficiosas como una sola sesin de 39minutos. Estas son algunas ideas para los ejercicios:  Lleve a Probation officer.  Utilice las Clinical cytogeneticist del ascensor.  Baile su cancin favorita.  Haga los ejercicios de un video de ejercicios.  Haga sus ejercicios favoritos con Gaffer. RECOMENDACIONES PARA REALIZAR EJERCICIOS CUANDO SE TIENE DIABETES TIPO 1 O TIPO 2   Controle la glucosa en la sangre antes de comenzar. Si el nivel de glucosa en la sangre es de ms de 240 mg/dl, controle las cetonas en la [place]. No haga actividad fsica si hay cetonas.  Evite inyectarse insulina en las zonas del cuerpo que ejercitar. Por ejemplo, evite inyectarse insulina en:  Los brazos, si juega al tenis.  Las piernas, si corre.  Lleve un registro de:  Los alimentos que consume antes y despus de TEFL teacher.  Los momentos esperables de picos de accin de la  insulina.  Los niveles de glucosa en la sangre antes y despus de hacer ejercicios.  El tipo y cantidad de [place] fsica que Musician.  Revise los registros con su mdico. El mdico lo ayudar a Actor pautas para ajustar la cantidad de alimento y las cantidades de insulina antes y despus de Field seismologist ejercicios.  Si toma insulina o agentes hipoglucemiantes por va oral, observe si hay signos y sntomas de hipoglucemia. Entre los que se incluyen:  Mareos.  Temblores.  Sudoracin.  Escalofros.  Confusin.  Beba gran cantidad de agua mientras hace ejercicios para evitar la deshidratacin o los golpes de Freight forwarder. Durante la actividad fsica se pierde agua corporal que se debe reponer.  Comente con su mdico antes de comenzar un programa de actividad fsica para verificar que sea seguro para usted. Recuerde, cualquier actividad es mejor que ninguna. Document Released: 05/25/2007 Document Revised: 09/19/2013 [facility] Patient Information 2015 [place], [place]. This information is not intended to replace advice given to  you by your health care provider. Make sure you discuss any questions you have with your health care provider.

## 2015-01-11 NOTE — Progress Notes (Signed)
Here for DM check up States she is not receiving enough of Janumet to last her a full month

## 2015-01-12 LAB — COMPLETE METABOLIC PANEL WITH GFR
ALBUMIN: 4.2 g/dL (ref 3.6–5.1)
ALK PHOS: 49 U/L (ref 33–115)
ALT: 9 U/L (ref 6–29)
AST: 13 U/L (ref 10–30)
BUN: 11 mg/dL (ref 7–25)
CALCIUM: 9.5 mg/dL (ref 8.6–10.2)
CHLORIDE: 104 mmol/L (ref 98–110)
CO2: 26 mmol/L (ref 20–31)
CREATININE: 0.57 mg/dL (ref 0.50–1.10)
GFR, Est African American: >89 mL/min (ref >=60)
GFR, Est Non African American: >89 mL/min (ref >=60)
Glucose, Bld: 129 mg/dL — ABNORMAL HIGH (ref 65–99)
Potassium: 5.5 mmol/L — ABNORMAL HIGH (ref 3.5–5.3)
Sodium: 136 mmol/L (ref 135–146)
Total Bilirubin: 0.8 mg/dL (ref 0.2–1.2)
Total Protein: 7.6 g/dL (ref 6.1–8.1)

## 2015-01-12 LAB — MICROALBUMIN / CREATININE URINE RATIO
Creatinine, Urine: 102.5 mg/dL
Microalb Creat Ratio: 28.3 mg/g (ref 0.0–30.0)
Microalb, Ur: 2.9 mg/dL — ABNORMAL HIGH (ref <2.0)

## 2015-01-29 ENCOUNTER — Encounter: Payer: Self-pay | Admitting: *Deleted

## 2015-02-14 NOTE — Telephone Encounter (Signed)
error 

## 2015-03-13 ENCOUNTER — Telehealth: Payer: Self-pay

## 2015-03-13 NOTE — Telephone Encounter (Signed)
-----   Message from Tresa Garter, MD sent at 01/12/2015  6:05 PM EDT ----- Please inform patient that her laboratory test results are mostly within normal limit. Continue blood sugar control, regular physical exercise at least 3 times a week 30 minutes each time.

## 2015-03-13 NOTE — Telephone Encounter (Signed)
Nurse called patient via [employer], Alyssa Garrison.  Telephone number provided in chart rang continuously without answer or voicemail.  Nurse calling patient to make patient aware of lab results from 01/11/15.

## 2015-03-19 ENCOUNTER — Emergency Department (HOSPITAL_COMMUNITY)
Admission: EM | Admit: 2015-03-19 | Discharge: 2015-03-19 | Disposition: A | Discharge status: Home / Self Care | Payer: Self-pay | Attending: Emergency Medicine | Admitting: Emergency Medicine

## 2015-03-19 ENCOUNTER — Encounter (HOSPITAL_COMMUNITY): Payer: Self-pay | Admitting: Emergency Medicine

## 2015-03-19 DIAGNOSIS — E119 Type 2 diabetes mellitus without complications: Secondary | ICD-10-CM | POA: Insufficient documentation

## 2015-03-19 DIAGNOSIS — Z79899 Other long term (current) drug therapy: Secondary | ICD-10-CM | POA: Insufficient documentation

## 2015-03-19 DIAGNOSIS — R002 Palpitations: Secondary | ICD-10-CM | POA: Insufficient documentation

## 2015-03-19 DIAGNOSIS — Z7982 Long term (current) use of aspirin: Secondary | ICD-10-CM | POA: Insufficient documentation

## 2015-03-19 HISTORY — DX: Major depressive disorder, single episode, unspecified: F32.9

## 2015-03-19 HISTORY — DX: Depression, unspecified: F32.A

## 2015-03-19 LAB — CBC
HCT: 36.3 % (ref 36.0–46.0)
Hemoglobin: 11.5 g/dL — ABNORMAL LOW (ref 12.0–15.0)
MCH: 27.3 pg (ref 26.0–34.0)
MCHC: 31.7 g/dL (ref 30.0–36.0)
MCV: 86 fL (ref 78.0–100.0)
Platelets: 298 10*3/uL (ref 150–400)
RBC: 4.22 MIL/uL (ref 3.87–5.11)
RDW: 13.8 % (ref 11.5–15.5)
WBC: 8.1 10*3/uL (ref 4.0–10.5)

## 2015-03-19 LAB — URINALYSIS, ROUTINE W REFLEX MICROSCOPIC
Bilirubin Urine: NEGATIVE
Glucose, UA: NEGATIVE mg/dL
Hgb urine dipstick: NEGATIVE
Ketones, ur: NEGATIVE mg/dL
LEUKOCYTES UA: NEGATIVE
Nitrite: NEGATIVE
PROTEIN: NEGATIVE mg/dL
SPECIFIC GRAVITY, URINE: 1.007 (ref 1.005–1.030)
UROBILINOGEN UA: 0.2 mg/dL (ref 0.0–1.0)
pH: 7 (ref 5.0–8.0)

## 2015-03-19 LAB — BASIC METABOLIC PANEL
Anion gap: 7 (ref 5–15)
BUN: 13 mg/dL (ref 6–20)
CALCIUM: 9.6 mg/dL (ref 8.9–10.3)
CO2: 28 mmol/L (ref 22–32)
CREATININE: 0.62 mg/dL (ref 0.44–1.00)
Chloride: 106 mmol/L (ref 101–111)
GFR calc non Af Amer: >60 mL/min (ref >60)
Glucose, Bld: 149 mg/dL — ABNORMAL HIGH (ref 65–99)
Potassium: 4.4 mmol/L (ref 3.5–5.1)
SODIUM: 141 mmol/L (ref 135–145)

## 2015-03-19 LAB — I-STAT TROPONIN, ED: Troponin i, poc: 0 ng/mL (ref 0.00–0.08)

## 2015-03-19 NOTE — Discharge Instructions (Signed)
Palpitaciones  (Palpitations)  Es la sensación de sentir que el latido cardíaco es irregular o es más rápido que lo normal. Se siente como un aleteo o que falta un latido. Generalmente no es un problema grave. Sin embargo, en algunos casos podría ser necesario hacer más estudios diagnósticos.  CAUSAS   Las causas de las palpitaciones pueden ser:  · Fumar.  · El consumo de cafeína u otros estimulantes, como pastillas para adelgazar o bebidas energizantes.  · Alcohol.  · Situaciones de estrés y ansiedad.  · La actividad física extenuante.  · Fatiga.  · Algunos medicamentos.  · Enfermedad cardíaca, especialmente si tiene antecedentes de ritmo cardíaco irregular (arritmia), como fibrilación auricular, aleteo auricular o taquicardia supraventricular.  · El uso incorrecto de un marcapasos o desfibrilador.  DIAGNÓSTICO   Para hallar la causa de las palpitaciones, el médico le hará una historia clínica y un examen físico. El médico también puede hacerle un estudio llamado electrocardiograma (ECG) ambulatorio. El ECG registra el patrón de los latidos cardíacos durante un período de 24 horas. También pueden hacerle otros estudios, por ejemplo:  · Ecocardiograma transtorácico (ETT). Durante el ecocardiograma, se usan ondas sonoras para evaluar cómo fluye la sangre por el corazón.  · Ecocardiograma transesofágico (ETE).  · Monitoreo cardíaco. Este estudio permite que el médico controle la frecuencia y el ritmo cardíaco en tiempo real.  · Monitor Holter. Es un dispositivo portátil que registra los latidos cardíacos y ayuda a diagnosticar las arritmias cardíacas. Le permite al médico registrar la actividad cardíaca durante varios días, si es necesario.  · Pruebas de estrés por ejercicio o por medicamentos que aceleran los latidos cardíacos.  TRATAMIENTO   El tratamiento de las palpitaciones depende de la causa y puede variar mucho. En la mayoría de los casos no se requiere otro tratamiento que esperar, relajarse y el controlar  los síntomas. Otras causas, como la fibrilación auricular, el aleteo auricular o la taquicardia supraventricular generalmente requieren un tratamiento.  INSTRUCCIONES PARA EL CUIDADO EN EL HOGAR   · Evite:    Bebidas que contengan cafeína como el café, el té, los refrescos, las pastillas para adelgazar y las bebidas energizantes.    Chocolate.    Alcohol.  · Si fuma, abandone el hábito.  · Reduzca los niveles de estrés y ansiedad. Algunas cosas que pueden ayudarlo a relajarse son:    Un método para controlar el cuerpo con la mente, por ejemplo, controlar los latidos (biorregulación).    El yoga.    La meditación.    La actividad física como natación, trote o caminatas.  · Descanse y duerma lo suficiente.  SOLICITE ATENCIÓN MÉDICA SI:   · Continúa con latidos cardíacos rápidos o irregulares durante más de 24 horas.  · Las palpitaciones le suceden con más frecuencia.  SOLICITE ATENCIÓN MÉDICA DE INMEDIATO SI:  · Siente falta de aire o dolor en el pecho.  · Sufre un dolor intenso de cabeza.  · Se siente mareado o se desmaya.  ASEGÚRESE DE QUE:  · Comprende estas instrucciones.  · Controlará su afección.  · Recibirá ayuda de inmediato si no mejora o si empeora.     Esta información no tiene como fin reemplazar el consejo del médico. Asegúrese de hacerle al médico cualquier pregunta que tenga.     Document Released: 02/12/2005 Document Revised: 05/10/2013  Elsevier Interactive Patient Education ©2016 Elsevier Inc.

## 2015-03-19 NOTE — ED Provider Notes (Signed)
CSN: [id number]     Arrival date & time 03/19/15  1751 History   First MD Initiated Contact with Patient 03/19/15 [phone]     Chief Complaint  Patient presents with  . Tachycardia     (Consider location/radiation/quality/duration/timing/severity/associated sxs/prior Treatment) Patient is a 39 y.o. female presenting with palpitations. The history is provided by the patient.  Palpitations Palpitations quality:  Regular Onset quality:  Sudden Duration:  5 minutes Timing:  Intermittent Progression:  Resolved Chronicity:  New Context: anxiety   Relieved by:  Nothing Worsened by:  Nothing Ineffective treatments:  None tried Associated symptoms: no chest pain, no chest pressure, no cough, no shortness of breath and no vomiting   Risk factors: diabetes mellitus     Past Medical History  Diagnosis Date  . Diabetes mellitus without complication ([place])   . Depression    History reviewed. No pertinent past surgical history. Family History  Problem Relation Age of Onset  . Diabetes Mother   . Diabetes Paternal Grandmother    Social History  Substance Use Topics  . Smoking status: Never Smoker   . Smokeless tobacco: None  . Alcohol Use: No   OB History    No data available     Review of Systems  Respiratory: Negative for cough and shortness of breath.   Cardiovascular: Positive for palpitations. Negative for chest pain.  Gastrointestinal: Negative for vomiting.  All other systems reviewed and are negative.     Allergies  Review of patient's allergies indicates no known allergies.  Home Medications   Prior to Admission medications   Medication Sig Start Date End Date Taking? Authorizing Provider  aspirin 81 MG tablet Take 1 tablet (81 mg total) by mouth daily. 04/28/13  Yes Olugbemiga Essie Christine, MD  glipiZIDE (GLUCOTROL) 10 MG tablet Take 2 tablets (20 mg total) by mouth 2 (two) times daily before a meal. 01/11/15  Yes Olugbemiga E Jegede, MD  glucose blood test strip Use  as instructed 06/02/13  Yes Olugbemiga E Doreene Burke, MD  glucose monitoring kit (FREESTYLE) monitoring kit 1 each by Does not apply route as needed for other. 06/02/13  Yes Tresa Garter, MD  Lancets (FREESTYLE) lancets Use as instructed 06/02/13  Yes Olugbemiga Essie Christine, MD  ramipril (ALTACE) 2.5 MG capsule Take 1 capsule (2.5 mg total) by mouth daily. 01/11/15  Yes Tresa Garter, MD  sitaGLIPtin-metformin (JANUMET) 50-1000 MG per tablet Take 1 tablet by mouth 2 (two) times daily with a meal. 01/11/15  Yes Olugbemiga E Jegede, MD   BP 152/76 mmHg  Pulse 77  Temp(Src) 98.2 F (36.8 C)  Resp 17  Ht $R'4\' 11"'iY$  (1.499 m)  Wt 150 lb (68.04 kg)  BMI 30.28 kg/m2  SpO2 100%  LMP 03/12/2015 Physical Exam  Constitutional: She is oriented to person, place, and time. She appears well-developed and well-nourished. No distress.  HENT:  Head: Normocephalic.  Eyes: Conjunctivae are normal.  Neck: Neck supple. No tracheal deviation present.  Cardiovascular: Normal rate, regular rhythm and normal heart sounds.  Exam reveals no gallop and no friction rub.   No murmur heard. Pulmonary/Chest: Effort normal and breath sounds normal. No respiratory distress.  Abdominal: Soft. She exhibits no distension. There is no tenderness.  Neurological: She is alert and oriented to person, place, and time.  Skin: Skin is warm and dry.  Psychiatric: She has a normal mood and affect.    ED Course  Procedures (including critical care time) Labs Review Labs Reviewed  BASIC  METABOLIC PANEL - Abnormal; Notable for the following:    Glucose, Bld 149 (*)    All other components within normal limits  CBC - Abnormal; Notable for the following:    Hemoglobin 11.5 (*)    All other components within normal limits  I-STAT TROPOININ, ED    Imaging Review No results found. I have personally reviewed and evaluated these images and lab results as part of my medical decision-making.   EKG Interpretation   Date/Time:   Monday March 19 2015 03:27:12 EDT Ventricular Rate:  78 PR Interval:  142 QRS Duration: 100 QT Interval:  392 QTC Calculation: 446 R Axis:   95 Text Interpretation:  Normal sinus rhythm Rightward axis Borderline ECG No  previous tracing Confirmed by Gracelyn Coventry MD, Shoaib Siefker ([id number]) on 03/19/2015  4:21:38 AM      MDM   Final diagnoses:  Intermittent palpitations     39 year old female presents with intermittent palpitations at home. She is also having some urgency to urinate recently and is concerned the 2 may be connected. This happened twice earlier tonight while sitting in bed without any provoking factors. EKG interpreted by me without ST or T wave changes concerning for myocardial ischemia. No delta wave, no prolonged QTc, no brugada to suggest arrhythmogenicity.   Otherwise well-appearing. No runs of tachycardia during her emergency department stay. Provided reassurance and recommended primary care physician follow-up for Holter monitoring is needed for recurrent symptoms. Return precautions were discussed for presyncope, chest pain, shortness of breath or other concerning signs or symptoms.   Leo Grosser, MD 03/19/15 0730

## 2015-03-19 NOTE — ED Notes (Signed)
Discharge instructions reviewed with patient/spouse. Understanding verbalized. Patient declined wheelchair at time of discharge.

## 2015-03-19 NOTE — ED Notes (Signed)
C/o fast HR since 2am.  Denies chest pain, nausea, and vomiting.  States same symptoms 1 week ago and it resolved after drinking "nervous tea."

## 2015-04-03 ENCOUNTER — Other Ambulatory Visit: Payer: Self-pay

## 2015-04-03 ENCOUNTER — Ambulatory Visit: Payer: Self-pay | Attending: Family Medicine | Admitting: Family Medicine

## 2015-04-03 ENCOUNTER — Other Ambulatory Visit: Payer: Self-pay | Admitting: Internal Medicine

## 2015-04-03 ENCOUNTER — Encounter: Payer: Self-pay | Admitting: Family Medicine

## 2015-04-03 VITALS — BP 133/82 | HR 66 | Temp 98.3°F | Resp 16 | Ht <= 58 in | Wt 148.0 lb

## 2015-04-03 DIAGNOSIS — E119 Type 2 diabetes mellitus without complications: Secondary | ICD-10-CM | POA: Insufficient documentation

## 2015-04-03 DIAGNOSIS — E118 Type 2 diabetes mellitus with unspecified complications: Secondary | ICD-10-CM

## 2015-04-03 DIAGNOSIS — I10 Essential (primary) hypertension: Secondary | ICD-10-CM | POA: Insufficient documentation

## 2015-04-03 DIAGNOSIS — Z0000 Encounter for general adult medical examination without abnormal findings: Secondary | ICD-10-CM

## 2015-04-03 LAB — GLUCOSE, POCT (MANUAL RESULT ENTRY): POC GLUCOSE: 117 mg/dL — AB (ref 70–99)

## 2015-04-03 LAB — POCT GLYCOSYLATED HEMOGLOBIN (HGB A1C): Hemoglobin A1C: 6.5

## 2015-04-03 MED ORDER — FREESTYLE LANCETS MISC: Status: DC

## 2015-04-03 MED ORDER — GLUCOSE BLOOD VI STRP: ORAL_STRIP | Status: DC

## 2015-04-03 MED ORDER — TRUEPLUS LANCETS 28G MISC: 1.0000 | Freq: Two times a day (BID) | Status: DC

## 2015-04-03 MED ORDER — TRUE METRIX METER W/DEVICE KIT: 1.0000 | PACK | Freq: Two times a day (BID) | Status: DC

## 2015-04-03 NOTE — Progress Notes (Signed)
Pt's here for ED f/up with heart palpitations and DM2. Pt denies pain.   Pt requesting medication refill on test strips.

## 2015-04-03 NOTE — Patient Instructions (Signed)
Diabetes Mellitus and Food It is important for you to manage your blood sugar (glucose) level. Your blood glucose level can be greatly affected by what you eat. Eating healthier foods in the appropriate amounts throughout the day at about the same time each day will help you control your blood glucose level. It can also help slow or prevent worsening of your diabetes mellitus. Healthy eating may even help you improve the level of your blood pressure and reach or maintain a healthy weight.  General recommendations for healthful eating and cooking habits include:  Eating meals and snacks regularly. Avoid going long periods of time without eating to lose weight.  Eating a diet that consists mainly of plant-based foods, such as fruits, vegetables, nuts, legumes, and whole grains.  Using low-heat cooking methods, such as baking, instead of high-heat cooking methods, such as deep frying. Work with your dietitian to make sure you understand how to use the Nutrition Facts information on food labels. HOW CAN FOOD AFFECT ME? Carbohydrates Carbohydrates affect your blood glucose level more than any other type of food. Your dietitian will help you determine how many carbohydrates to eat at each meal and teach you how to count carbohydrates. Counting carbohydrates is important to keep your blood glucose at a healthy level, especially if you are using insulin or taking certain medicines for diabetes mellitus. Alcohol Alcohol can cause sudden decreases in blood glucose (hypoglycemia), especially if you use insulin or take certain medicines for diabetes mellitus. Hypoglycemia can be a life-threatening condition. Symptoms of hypoglycemia (sleepiness, dizziness, and disorientation) are similar to symptoms of having too much alcohol.  If your health care provider has given you approval to drink alcohol, do so in moderation and use the following guidelines:  Women should not have more than one drink per day, and men  should not have more than two drinks per day. One drink is equal to:  12 oz of beer.  5 oz of wine.  1 oz of hard liquor.  Do not drink on an empty stomach.  Keep yourself hydrated. Have water, diet soda, or unsweetened iced tea.  Regular soda, juice, and other mixers might contain a lot of carbohydrates and should be counted. WHAT FOODS ARE NOT RECOMMENDED? As you make food choices, it is important to remember that all foods are not the same. Some foods have fewer nutrients per serving than other foods, even though they might have the same number of calories or carbohydrates. It is difficult to get your body what it needs when you eat foods with fewer nutrients. Examples of foods that you should avoid that are high in calories and carbohydrates but low in nutrients include:  Trans fats (most processed foods list trans fats on the Nutrition Facts label).  Regular soda.  Juice.  Candy.  Sweets, such as cake, pie, doughnuts, and cookies.  Fried foods. WHAT FOODS CAN I EAT? Eat nutrient-rich foods, which will nourish your body and keep you healthy. The food you should eat also will depend on several factors, including:  The calories you need.  The medicines you take.  Your weight.  Your blood glucose level.  Your blood pressure level.  Your cholesterol level. You should eat a variety of foods, including:  Protein.  Lean cuts of meat.  Proteins low in saturated fats, such as fish, egg whites, and beans. Avoid processed meats.  Fruits and vegetables.  Fruits and vegetables that may help control blood glucose levels, such as apples, mangoes, and   yams.  Dairy products.  Choose fat-free or low-fat dairy products, such as milk, yogurt, and cheese.  Grains, bread, pasta, and rice.  Choose whole grain products, such as multigrain bread, whole oats, and brown rice. These foods may help control blood pressure.  Fats.  Foods containing healthful fats, such as nuts,  avocado, olive oil, canola oil, and fish. DOES EVERYONE WITH DIABETES MELLITUS HAVE THE SAME MEAL PLAN? Because every person with diabetes mellitus is different, there is not one meal plan that works for everyone. It is very important that you meet with a dietitian who will help you create a meal plan that is just right for you.   This information is not intended to replace advice given to you by your health care provider. Make sure you discuss any questions you have with your health care provider.   Document Released: 01/30/2005 Document Revised: 05/26/2014 Document Reviewed: 04/01/2013 Elsevier Interactive Patient Education 2016 Elsevier Inc.  

## 2015-04-03 NOTE — Progress Notes (Signed)
CC: ED follow up  HPI: Alyssa Garrison is a 39 y.o. female with a history of Type 2 DM (A1c 6.5) who comes in today for a follow up from [facility] ED after she had presented with palpitations in the absence of dyspnea or chest pain: troponin was negative, EKG was normal and she was subsequently discharged. She states symptoms have resolved at this time.  She is requesting refills on test strips and lancets.  Patient has No headache, No chest pain, No abdominal pain - No Nausea, No new weakness tingling or numbness, No Cough - SOB.  No Known Allergies Past Medical History  Diagnosis Date  . Diabetes mellitus without complication ([place])   . Depression    Current Outpatient Prescriptions on File Prior to Visit  Medication Sig Dispense Refill  . aspirin 81 MG tablet Take 1 tablet (81 mg total) by mouth daily. 90 tablet 3  . glipiZIDE (GLUCOTROL) 10 MG tablet Take 2 tablets (20 mg total) by mouth 2 (two) times daily before a meal. 180 tablet 3  . glucose blood test strip Use as instructed 100 each 12  . glucose monitoring kit (FREESTYLE) monitoring kit 1 each by Does not apply route as needed for other. 1 each 2  . Lancets (FREESTYLE) lancets Use as instructed 100 each 12  . ramipril (ALTACE) 2.5 MG capsule Take 1 capsule (2.5 mg total) by mouth daily. 90 capsule 3  . sitaGLIPtin-metformin (JANUMET) 50-1000 MG per tablet Take 1 tablet by mouth 2 (two) times daily with a meal. 180 tablet 3   No current facility-administered medications on file prior to visit.   Family History  Problem Relation Age of Onset  . Diabetes Mother   . Diabetes Paternal Grandmother    Social History   Social History  . Marital Status: Single    Spouse Name: N/A  . Number of Children: N/A  . Years of Education: N/A   Occupational History  . Not on file.   Social History Main Topics  . Smoking status: Never Smoker   . Smokeless tobacco: Not on file  . Alcohol Use: No  . Drug Use: No  .  Sexual Activity: Not on file   Other Topics Concern  . Not on file   Social History Narrative    Review of Systems: Constitutional: Negative for fever, chills, diaphoresis, activity change, appetite change and fatigue. HENT: Negative for ear pain, nosebleeds, congestion, facial swelling, rhinorrhea, neck pain, neck stiffness and ear discharge.  Eyes: Negative for pain, discharge, redness, itching and visual disturbance. Respiratory: Negative for cough, choking, chest tightness, shortness of breath, wheezing and stridor.  Cardiovascular: Negative for chest pain, palpitations and leg swelling. Gastrointestinal: Negative for abdominal distention. Genitourinary: Negative for dysuria, urgency, frequency, hematuria, flank pain, decreased urine volume, difficulty urinating and dyspareunia.  Musculoskeletal: Negative for back pain, joint swelling, arthralgias and gait problem. Neurological: Negative for dizziness, tremors, seizures, syncope, facial asymmetry, speech difficulty, weakness, light-headedness, numbness and headaches.  Hematological: Negative for adenopathy. Does not bruise/bleed easily. Psychiatric/Behavioral: Negative for hallucinations, behavioral problems, confusion, dysphoric mood, decreased concentration and agitation.    Objective:   Filed Vitals:   04/03/15 0907  BP: 133/82  Pulse: 66  Temp: 98.3 F (36.8 C)  Resp: 16    Physical Exam: Constitutional: Patient appears well-developed and well-nourished. No distress. HENT: Normocephalic, atraumatic, External right and left ear normal. Oropharynx is clear and moist.  Eyes: Conjunctivae and EOM are normal. PERRLA, no scleral icterus. Neck:  Normal ROM. Neck supple. No JVD. No tracheal deviation. No thyromegaly. CVS: RRR, S1/S2 +, no murmurs, no gallops, no carotid bruit.  Pulmonary: Effort and breath sounds normal, no stridor, rhonchi, wheezes, rales.  Abdominal: Soft. BS +,  no distension, tenderness, rebound or  guarding.  Musculoskeletal: Normal range of motion. No edema and no tenderness.  Lymphadenopathy: No lymphadenopathy noted, cervical, inguinal or axillary Neuro: Alert. Normal reflexes, muscle tone coordination. No cranial nerve deficit. Skin: Skin is warm and dry. No rash noted. Not diaphoretic. No erythema. No pallor. Psychiatric: Normal mood and affect. Behavior, judgment, thought content normal.  Lab Results  Component Value Date   WBC 8.1 03/19/2015   HGB 11.5* 03/19/2015   HCT 36.3 03/19/2015   MCV 86.0 03/19/2015   PLT 298 03/19/2015   Lab Results  Component Value Date   CREATININE 0.62 03/19/2015   BUN 13 03/19/2015   NA 141 03/19/2015   K 4.4 03/19/2015   CL 106 03/19/2015   CO2 28 03/19/2015    Lab Results  Component Value Date   HGBA1C 6.50 04/03/2015   Lipid Panel     Component Value Date/Time   CHOL 166 03/08/2014 1032   TRIG 78 03/08/2014 1032   HDL 51 03/08/2014 1032   CHOLHDL 3.3 03/08/2014 1032   VLDL 16 03/08/2014 1032   LDLCALC 99 03/08/2014 1032       Assessment and plan:  Type 2 diabetes mellitus: Controlled with A1c of 6.5 Continue Janumet and glipizide.  Hypertension: Controlled. Continue ramipril. Advised to come in for a complete physical including Pap smear and mammogram next month.       Arnoldo Morale, [facility] [phone] 04/03/2015, 9:26 AM

## 2015-04-18 ENCOUNTER — Encounter: Payer: Self-pay | Admitting: Family Medicine

## 2015-04-18 ENCOUNTER — Telehealth: Payer: Self-pay

## 2015-04-18 NOTE — Telephone Encounter (Signed)
Nira Conn tried reaching this patient back in October for lab results from 01/11/15. Patient didn't respond back, so a letter will be send out to patient address on file.

## 2015-04-18 NOTE — Telephone Encounter (Signed)
-----   Message from Nila Nephew, RN sent at 03/13/2015 11:59 AM EDT ----- Maudie Mercury, I called this patient today to give results from 01/11/15. Please try to call patient again tomorrow. If you can't reach her tomorrow, please send letter and close encounter.   ----- Message -----    From: Tresa Garter, MD    Sent: 01/12/2015   6:05 PM      To: Esperanza Sheets, RN  Please inform patient that her laboratory test results are mostly within normal limit. Continue blood sugar control, regular physical exercise at least 3 times a week 30 minutes each time.

## 2015-05-03 ENCOUNTER — Encounter: Payer: Self-pay | Admitting: Family Medicine

## 2015-05-03 ENCOUNTER — Other Ambulatory Visit: Payer: Self-pay | Admitting: *Deleted

## 2015-05-03 ENCOUNTER — Ambulatory Visit: Payer: Self-pay | Attending: Family Medicine | Admitting: Family Medicine

## 2015-05-03 ENCOUNTER — Encounter: Payer: Self-pay | Admitting: *Deleted

## 2015-05-03 VITALS — BP 141/81 | HR 60 | Temp 98.3°F | Resp 13 | Ht <= 58 in | Wt 151.0 lb

## 2015-05-03 DIAGNOSIS — Z0000 Encounter for general adult medical examination without abnormal findings: Secondary | ICD-10-CM

## 2015-05-03 DIAGNOSIS — B373 Candidiasis of vulva and vagina: Secondary | ICD-10-CM

## 2015-05-03 DIAGNOSIS — E119 Type 2 diabetes mellitus without complications: Secondary | ICD-10-CM

## 2015-05-03 DIAGNOSIS — F329 Major depressive disorder, single episode, unspecified: Secondary | ICD-10-CM | POA: Insufficient documentation

## 2015-05-03 DIAGNOSIS — E1310 Other specified diabetes mellitus with ketoacidosis without coma: Secondary | ICD-10-CM | POA: Insufficient documentation

## 2015-05-03 DIAGNOSIS — I10 Essential (primary) hypertension: Secondary | ICD-10-CM | POA: Insufficient documentation

## 2015-05-03 DIAGNOSIS — Z23 Encounter for immunization: Secondary | ICD-10-CM

## 2015-05-03 DIAGNOSIS — Z7984 Long term (current) use of oral hypoglycemic drugs: Secondary | ICD-10-CM | POA: Insufficient documentation

## 2015-05-03 DIAGNOSIS — Z79899 Other long term (current) drug therapy: Secondary | ICD-10-CM | POA: Insufficient documentation

## 2015-05-03 DIAGNOSIS — Z7982 Long term (current) use of aspirin: Secondary | ICD-10-CM | POA: Insufficient documentation

## 2015-05-03 DIAGNOSIS — B3731 Acute candidiasis of vulva and vagina: Secondary | ICD-10-CM

## 2015-05-03 LAB — GLUCOSE, POCT (MANUAL RESULT ENTRY): POC Glucose: 113 mg/dl — AB (ref 70–99)

## 2015-05-03 MED ORDER — FLUCONAZOLE 150 MG PO TABS: 150.0000 mg | ORAL_TABLET | Freq: Once | ORAL | Status: DC

## 2015-05-03 NOTE — Progress Notes (Signed)
Patient here for annual and pap She reports external vaginal itching on the pubic bone

## 2015-05-03 NOTE — Progress Notes (Signed)
Patient ID: Alyssa Garrison, female   DOB: 1976/04/14, 39 y.o.   MRN: [id number]   Patient was assisted in the office today to fill out the [place] application.  She is in the process of applying for the orange card.  Application was faxed to the [employer] and patient was told she would be called by an employee from there who speaks Spanish.  Fax confirmation obtained and application was scanned into patient chart.

## 2015-05-03 NOTE — Progress Notes (Signed)
Subjective:  Patient ID: Alyssa Garrison, female    DOB: Aug 15, 1975  Age: 40 y.o. MRN: [id number]  CC: Annual Exam and Gynecologic Exam   HPI Alyssa Garrison is a 39 year old female with a history of type 2 diabetes mellitus who comes in for an annual physical exam. She  Complains of some vaginal itching but no discharge and no urinary symptoms noted. Medical history is positive for type 2 diabetes mellitus and she is compliant with her medications  Past Medical History  Diagnosis Date  . Diabetes mellitus without complication ([place])   . Depression     History reviewed. No pertinent past surgical history.  No Known Allergies  Social History   Social History  . Marital Status: Single    Spouse Name: N/A  . Number of Children: N/A  . Years of Education: N/A   Occupational History  . Not on file.   Social History Main Topics  . Smoking status: Never Smoker   . Smokeless tobacco: Not on file  . Alcohol Use: No  . Drug Use: No  . Sexual Activity: Not on file   Other Topics Concern  . Not on file   Social History Narrative      Outpatient Prescriptions Prior to Visit  Medication Sig Dispense Refill  . aspirin 81 MG tablet Take 1 tablet (81 mg total) by mouth daily. 90 tablet 3  . Blood Glucose Monitoring Suppl (TRUE METRIX METER) W/DEVICE KIT 1 each by Does not apply route 2 (two) times daily before a meal. 1 kit 0  . glipiZIDE (GLUCOTROL) 10 MG tablet Take 2 tablets (20 mg total) by mouth 2 (two) times daily before a meal. 180 tablet 3  . Glucose Blood (TRUE METRIX BLOOD GLUCOSE TEST VI)   0  . glucose blood (TRUE METRIX BLOOD GLUCOSE TEST) test strip BID before meals 100 each 12  . glucose blood test strip Use as instructed 100 each 12  . glucose blood test strip   12  . glucose monitoring kit (FREESTYLE) monitoring kit 1 each by Does not apply route as needed for other. 1 each 2  . Lancets (FREESTYLE) lancets Use as instructed 100 each 12  .  ramipril (ALTACE) 2.5 MG capsule Take 1 capsule (2.5 mg total) by mouth daily. 90 capsule 3  . sitaGLIPtin-metformin (JANUMET) 50-1000 MG per tablet Take 1 tablet by mouth 2 (two) times daily with a meal. 180 tablet 3  . TRUEPLUS LANCETS 28G MISC 1 each by Does not apply route 2 (two) times daily before a meal. 100 each 11  . TRUETEST TEST test strip USE AS INSTRUCTED 100 each 12   No facility-administered medications prior to visit.    ROS Review of Systems  Constitutional: Negative for activity change, appetite change and fatigue.  HENT: Negative for congestion, sinus pressure and sore throat.   Eyes: Negative for visual disturbance.  Respiratory: Negative for cough, chest tightness, shortness of breath and wheezing.   Cardiovascular: Negative for chest pain and palpitations.  Gastrointestinal: Negative for abdominal pain, constipation and abdominal distention.  Endocrine: Negative for polydipsia.  Genitourinary:       See history of present illness  Musculoskeletal: Negative for back pain and arthralgias.  Skin: Negative for rash.  Neurological: Negative for tremors, light-headedness and numbness.  Hematological: Does not bruise/bleed easily.  Psychiatric/Behavioral: Negative for behavioral problems and agitation.    Objective:  BP 141/81 mmHg  Pulse 60  Temp(Src) 98.3 F (36.8 C)  Resp 13  Ht 4' 8.5" (1.435 m)  Wt 151 lb (68.493 kg)  BMI 33.26 kg/m2  SpO2 100%  BP/Weight 05/03/2015 04/03/2015 03/19/2015  Systolic BP 141 133 118  Diastolic BP 81 82 61  Wt. (Lbs) 151 148 150  BMI 33.26 32.33 30.28      Physical Exam  Constitutional: She is oriented to person, place, and time. She appears well-developed and well-nourished. No distress.  HENT:  Head: Normocephalic.  Right Ear: External ear normal.  Left Ear: External ear normal.  Nose: Nose normal.  Mouth/Throat: Oropharynx is clear and moist.  Eyes: Conjunctivae and EOM are normal. Pupils are equal, round, and  reactive to light.  Neck: Normal range of motion. No JVD present.  Cardiovascular: Normal rate, regular rhythm, normal heart sounds and intact distal pulses.  Exam reveals no gallop.   No murmur heard. Pulmonary/Chest: Effort normal and breath sounds normal. No respiratory distress. She has no wheezes. She has no rales. She exhibits no tenderness.  Abdominal: Soft. Bowel sounds are normal. She exhibits no distension and no mass. There is no tenderness.  Genitourinary: No breast swelling, tenderness or discharge. Vaginal discharge found.  Normal cervix, normal adnexa, no cervical motion tenderness.  Musculoskeletal: Normal range of motion. She exhibits no edema or tenderness.  Neurological: She is alert and oriented to person, place, and time. She has normal reflexes.  Skin: Skin is warm and dry. She is not diaphoretic.  Psychiatric: She has a normal mood and affect.     Assessment & Plan:   1. Controlled type 2 diabetes mellitus with ketoacidosis without coma, without long-term current use of insulin (HCC) Controlled with A1c of 6.5 - Glucose (CBG) - Lipid panel; Future  2. Health care maintenance - MM DIGITAL SCREENING BILATERAL; Future - HIV antibody (with reflex)  3. Vaginal Candidiasis -Treated  4. Hypertension Currently on ramipril. Advised on low sodium, DASH diet and a blood pressure remains elevated at her next visit I will raise the dose of ramipril. No orders of the defined types were placed in this encounter.    Follow-up: Return in about 3 months (around 08/01/2015) for Follow-up of diabetes mellitus.   Enobong Amao MD    

## 2015-05-07 LAB — CYTOLOGY - PAP

## 2015-05-08 ENCOUNTER — Telehealth: Payer: Self-pay

## 2015-05-08 ENCOUNTER — Ambulatory Visit: Payer: Self-pay | Attending: Family Medicine

## 2015-05-08 NOTE — Telephone Encounter (Signed)
-----   Message from Arnoldo Morale, MD sent at 05/08/2015  8:49 AM EST ----- Please inform her that her PAP smear is normal and GC, chlamydia cultures are negative.

## 2015-05-08 NOTE — Telephone Encounter (Signed)
CMA called [employer] and spoke with Clifton James [phone]. Interpreter left message for patient to return my call.

## 2015-05-09 NOTE — Telephone Encounter (Signed)
[place] called [place] interpreter and spoke to [facility] [id number]. Interpreter placed the call and, but no one picked up, and VM was available to leave a message.   Letter sent to patients address on file for patient to call me asap for some important information.

## 2015-05-09 NOTE — Telephone Encounter (Signed)
-----   Message from Arnoldo Morale, MD sent at 05/08/2015  8:49 AM EST ----- Please inform her that her PAP smear is normal and GC, chlamydia cultures are negative.

## 2015-05-15 ENCOUNTER — Ambulatory Visit: Payer: Self-pay | Attending: Family Medicine

## 2015-05-15 DIAGNOSIS — E119 Type 2 diabetes mellitus without complications: Secondary | ICD-10-CM

## 2015-05-15 DIAGNOSIS — Z0000 Encounter for general adult medical examination without abnormal findings: Secondary | ICD-10-CM

## 2015-05-15 LAB — LIPID PANEL
CHOL/HDL RATIO: 2.9 ratio (ref <=5.0)
Cholesterol: 165 mg/dL (ref 125–200)
HDL: 56 mg/dL (ref >=46)
LDL Cholesterol: 83 mg/dL (ref <130)
Triglycerides: 131 mg/dL (ref <150)
VLDL: 26 mg/dL (ref <30)

## 2015-05-15 LAB — HIV ANTIBODY (ROUTINE TESTING W REFLEX): HIV: NONREACTIVE

## 2015-05-16 ENCOUNTER — Telehealth: Payer: Self-pay

## 2015-05-16 NOTE — Telephone Encounter (Signed)
[place] called [place] interpreter and spoke to [place] [phone] or [phone]. Interpreter placed the call and said that the person who answered the phone stated that there is no one by the name of [place]. No message was left.

## 2015-05-16 NOTE — Telephone Encounter (Signed)
-----   Message from Arnoldo Morale, MD sent at 05/16/2015  9:10 AM EST ----- Please inform the patient that labs are normal. Thank you.

## 2015-06-06 MED FILL — ?GLIPIZIDE 10 MG TABLET: 10 | 30 days supply | Qty: 120 | Fill #3

## 2015-06-08 ENCOUNTER — Other Ambulatory Visit: Payer: Self-pay | Admitting: Internal Medicine

## 2015-06-08 MED ORDER — METFORMIN HCL 1000 MG PO TABS: 1000.0000 mg | ORAL_TABLET | Freq: Two times a day (BID) | ORAL | Status: DC

## 2015-06-27 MED FILL — metFORMIN HCL 1000 MG TABS: 1000 | 30 days supply | Qty: 60 | Fill #0

## 2015-06-27 MED FILL — RAMIPRIL 2.5 MG CAPSULE: 2.5 | 30 days supply | Qty: 30 | Fill #2

## 2015-07-04 ENCOUNTER — Ambulatory Visit: Payer: Self-pay | Attending: Family Medicine

## 2015-07-18 MED FILL — ?GLIPIZIDE 10 MG TABLET: 10 | 30 days supply | Qty: 120 | Fill #4

## 2015-07-18 MED FILL — RAMIPRIL 2.5 MG CAPSULE: 2.5 | 30 days supply | Qty: 30 | Fill #3

## 2015-08-03 ENCOUNTER — Other Ambulatory Visit: Payer: Self-pay | Admitting: Internal Medicine

## 2015-08-15 ENCOUNTER — Encounter: Payer: Self-pay | Admitting: Family Medicine

## 2015-08-15 ENCOUNTER — Ambulatory Visit: Payer: Self-pay | Attending: Family Medicine | Admitting: Family Medicine

## 2015-08-15 VITALS — BP 144/82 | HR 68 | Temp 98.3°F | Resp 16 | Ht 59.0 in | Wt 146.8 lb

## 2015-08-15 DIAGNOSIS — I10 Essential (primary) hypertension: Secondary | ICD-10-CM | POA: Insufficient documentation

## 2015-08-15 DIAGNOSIS — E669 Obesity, unspecified: Secondary | ICD-10-CM | POA: Insufficient documentation

## 2015-08-15 DIAGNOSIS — Z7982 Long term (current) use of aspirin: Secondary | ICD-10-CM | POA: Insufficient documentation

## 2015-08-15 DIAGNOSIS — F329 Major depressive disorder, single episode, unspecified: Secondary | ICD-10-CM | POA: Insufficient documentation

## 2015-08-15 DIAGNOSIS — E118 Type 2 diabetes mellitus with unspecified complications: Secondary | ICD-10-CM

## 2015-08-15 DIAGNOSIS — E119 Type 2 diabetes mellitus without complications: Secondary | ICD-10-CM | POA: Insufficient documentation

## 2015-08-15 DIAGNOSIS — Z79899 Other long term (current) drug therapy: Secondary | ICD-10-CM | POA: Insufficient documentation

## 2015-08-15 LAB — COMPLETE METABOLIC PANEL WITH GFR
ALT: 13 U/L (ref 6–29)
AST: 18 U/L (ref 10–30)
Albumin: 4.2 g/dL (ref 3.6–5.1)
Alkaline Phosphatase: 49 U/L (ref 33–115)
BUN: 13 mg/dL (ref 7–25)
CALCIUM: 9.2 mg/dL (ref 8.6–10.2)
CHLORIDE: 104 mmol/L (ref 98–110)
CO2: 22 mmol/L (ref 20–31)
CREATININE: 0.54 mg/dL (ref 0.50–1.10)
GFR, Est African American: >89 mL/min (ref >=60)
GFR, Est Non African American: >89 mL/min (ref >=60)
GLUCOSE: 158 mg/dL — AB (ref 65–99)
POTASSIUM: 4.1 mmol/L (ref 3.5–5.3)
SODIUM: 139 mmol/L (ref 135–146)
Total Bilirubin: 0.5 mg/dL (ref 0.2–1.2)
Total Protein: 7.8 g/dL (ref 6.1–8.1)

## 2015-08-15 LAB — GLUCOSE, POCT (MANUAL RESULT ENTRY): POC GLUCOSE: 154 mg/dL — AB (ref 70–99)

## 2015-08-15 LAB — POCT GLYCOSYLATED HEMOGLOBIN (HGB A1C): HEMOGLOBIN A1C: 6.8

## 2015-08-15 MED ORDER — METFORMIN HCL 1000 MG PO TABS: 1000.0000 mg | ORAL_TABLET | Freq: Two times a day (BID) | ORAL | Status: DC

## 2015-08-15 MED ORDER — GLIPIZIDE 10 MG PO TABS: 20.0000 mg | ORAL_TABLET | Freq: Two times a day (BID) | ORAL | Status: DC

## 2015-08-15 MED ORDER — RAMIPRIL 2.5 MG PO CAPS: 2.5000 mg | ORAL_CAPSULE | Freq: Every day | ORAL | Status: DC

## 2015-08-15 MED FILL — RAMIPRIL 2.5 MG CAPSULE: 2.5 | 30 days supply | Qty: 30 | Fill #0

## 2015-08-15 MED FILL — metFORMIN HCL 1000 MG TABS: 1000 | 30 days supply | Qty: 60 | Fill #0

## 2015-08-15 MED FILL — glipiZIDE 10 MG TABS: 10 | 30 days supply | Qty: 120 | Fill #0

## 2015-08-15 NOTE — Progress Notes (Signed)
CC: Follow-up on diabetes mellitus  HPI: Alyssa Garrison is a 40 y.o. female with a history of type 2 diabetes mellitus (A1c 6.8), hypertension here today for a follow up visit.   She has been out of metformin for the last 2 weeks and reports that fasting sugars have been in the 115 to 120 range and random sugars have been in the 150-170 range. Last eye exam was performed in the summer of last year. Adherent to a diabetic diet but does not exercise much.  She has no complaints today Patient has No headache, No chest pain, No abdominal pain - No Nausea, No new weakness tingling or numbness, No Cough - SOB.  No Known Allergies Past Medical History  Diagnosis Date  . Diabetes mellitus without complication ([place])   . Depression    Current Outpatient Prescriptions on File Prior to Visit  Medication Sig Dispense Refill  . aspirin 81 MG tablet Take 1 tablet (81 mg total) by mouth daily. 90 tablet 3  . Blood Glucose Monitoring Suppl (TRUE METRIX METER) W/DEVICE KIT 1 each by Does not apply route 2 (two) times daily before a meal. 1 kit 0  . Glucose Blood (TRUE METRIX BLOOD GLUCOSE TEST VI)   0  . glucose blood (TRUE METRIX BLOOD GLUCOSE TEST) test strip BID before meals 100 each 12  . glucose blood test strip Use as instructed 100 each 12  . glucose blood test strip   12  . glucose monitoring kit (FREESTYLE) monitoring kit 1 each by Does not apply route as needed for other. 1 each 2  . Lancets (FREESTYLE) lancets Use as instructed 100 each 12  . TRUEPLUS LANCETS 28G MISC 1 each by Does not apply route 2 (two) times daily before a meal. 100 each 11  . TRUETEST TEST test strip USE AS INSTRUCTED 100 each 12   No current facility-administered medications on file prior to visit.   Family History  Problem Relation Age of Onset  . Diabetes Mother   . Diabetes Paternal Grandmother    Social History   Social History  . Marital Status: Single    Spouse Name: N/A  . Number of  Children: N/A  . Years of Education: N/A   Occupational History  . Not on file.   Social History Main Topics  . Smoking status: Never Smoker   . Smokeless tobacco: Not on file  . Alcohol Use: No  . Drug Use: No  . Sexual Activity: Not on file   Other Topics Concern  . Not on file   Social History Narrative    Review of Systems: Constitutional: Negative for fever, chills, diaphoresis, activity change, appetite change and fatigue. HENT: Negative for ear pain, nosebleeds, congestion, facial swelling, rhinorrhea, neck pain, neck stiffness and ear discharge.  Eyes: Negative for pain, discharge, redness, itching and visual disturbance. Respiratory: Negative for cough, choking, chest tightness, shortness of breath, wheezing and stridor.  Cardiovascular: Negative for chest pain, palpitations and leg swelling. Gastrointestinal: Negative for abdominal distention. Genitourinary: Negative for dysuria, urgency, frequency, hematuria, flank pain, decreased urine volume, difficulty urinating and dyspareunia.  Musculoskeletal: Negative for back pain, joint swelling, arthralgias and gait problem. Neurological: Negative for dizziness, tremors, seizures, syncope, facial asymmetry, speech difficulty, weakness, light-headedness, numbness and headaches.  Hematological: Negative for adenopathy. Does not bruise/bleed easily. Psychiatric/Behavioral: Negative for hallucinations, behavioral problems, confusion, dysphoric mood, decreased concentration and agitation.    Objective:   Filed Vitals:   08/15/15 1514  BP: 144/82  Pulse: 68  Temp: 98.3 F (36.8 C)  Resp: 16    Physical Exam: Constitutional: Patient appears obese, well-developed and well-nourished. No distress. HENT: Normocephalic, atraumatic, External right and left ear normal. Oropharynx is clear and moist.  Eyes: Conjunctivae and EOM are normal. PERRLA, no scleral icterus. Neck: Normal ROM. Neck supple. No JVD. No tracheal deviation.  No thyromegaly. CVS: RRR, S1/S2 +, no murmurs, no gallops, no carotid bruit.  Pulmonary: Effort and breath sounds normal, no stridor, rhonchi, wheezes, rales.  Abdominal: Soft. BS +,  no distension, tenderness, rebound or guarding.  Musculoskeletal: Normal range of motion. No edema and no tenderness.  Lymphadenopathy: No lymphadenopathy noted, cervical, inguinal or axillary Neuro: Alert. Normal reflexes, muscle tone coordination. No cranial nerve deficit. Skin: Skin is warm and dry. No rash noted. Not diaphoretic. No erythema. No pallor. Psychiatric: Normal mood and affect. Behavior, judgment, thought content normal.  Lab Results  Component Value Date   WBC 8.1 03/19/2015   HGB 11.5* 03/19/2015   HCT 36.3 03/19/2015   MCV 86.0 03/19/2015   PLT 298 03/19/2015   Lab Results  Component Value Date   CREATININE 0.62 03/19/2015   BUN 13 03/19/2015   NA 141 03/19/2015   K 4.4 03/19/2015   CL 106 03/19/2015   CO2 28 03/19/2015    Lab Results  Component Value Date   HGBA1C 6.8 08/15/2015   Lipid Panel     Component Value Date/Time   CHOL 165 05/15/2015 0902   TRIG 131 05/15/2015 0902   HDL 56 05/15/2015 0902   CHOLHDL 2.9 05/15/2015 0902   VLDL 26 05/15/2015 0902   LDLCALC 83 05/15/2015 0902       Assessment and plan:  Type 2 diabetes mellitus: -Controlled with A1c of 6.8 which has trended up from 6.53 months ago. -Mild elevation could be secondary to running out of metformin -Advised to schedule annual eye exam with optometrist. -Foot exam performed today. -ADA diet and exercise  Hypertension: -BP elevated above goal of less than 140/90 -Could be due to running out of ramipril. -Refilled antihypertensive. -Educated on DASH, low-sodium diet  Obesity: -Educated on reducing portion sizes and increasing physical activity.  This note has been created with Surveyor, quantity. Any transcriptional errors are unintentional.          Arnoldo Morale, MD. [facility] [phone] 08/15/2015, 3:33 PM

## 2015-08-15 NOTE — Progress Notes (Signed)
Patient's here for DM f/up. Pt states she's feeling good.  Patient has no further concerns.  Patient requesting refills on medication.

## 2015-08-15 NOTE — Patient Instructions (Signed)
La diabetes mellitus y los alimentos (Diabetes Mellitus and Food) Es importante que controle su nivel de azcar en la sangre (glucosa). El nivel de glucosa en sangre depende en gran medida de lo que usted come. Comer alimentos saludables en las cantidades [place] a lo largo del Training and development officer, aproximadamente a la misma hora [employer], lo ayudar a Chief Technology Officer su nivel de Multimedia programmer. Tambin puede ayudarlo a retrasar o Patent attorney de la diabetes mellitus. Comer de [employer] saludable incluso puede ayudarlo a Chartered loss adjuster de presin arterial y a Science writer o Theatre manager un peso saludable.  Entre las recomendaciones generales para alimentarse y Audiological scientist los alimentos de forma saludable, se incluyen las siguientes:  Respetar las comidas principales y comer colaciones con regularidad. Evitar pasar largos perodos sin comer con el fin de perder peso.  Seguir una dieta que consista principalmente en alimentos de origen vegetal, como frutas, vegetales, frutos secos, legumbres y cereales integrales.  Utilizar mtodos de coccin a baja temperatura, como hornear, en lugar de mtodos de coccin a alta temperatura, como frer en abundante aceite. Trabaje con el nutricionista para aprender a Financial planner nutricional de las etiquetas de los alimentos. CMO PUEDEN AFECTARME LOS ALIMENTOS? Carbohidratos Los carbohidratos afectan el nivel de glucosa en sangre ms que cualquier otro tipo de alimento. El nutricionista lo ayudar a Teacher, adult education cuntos carbohidratos puede consumir en cada comida y ensearle a contarlos. El recuento de carbohidratos es importante para mantener la glucosa en sangre en un nivel saludable, en especial si utiliza insulina o toma determinados medicamentos para la diabetes mellitus. Alcohol El alcohol puede provocar disminuciones sbitas de la glucosa en sangre (hipoglucemia), en especial si utiliza insulina o toma determinados medicamentos para la diabetes mellitus. La  hipoglucemia es una afeccin que puede poner en peligro la vida. Los sntomas de la hipoglucemia (somnolencia, mareos y Data processing manager) son similares a los sntomas de haber consumido mucho alcohol.  Si el mdico lo autoriza a beber alcohol, hgalo con moderacin y siga estas pautas:  Las mujeres no deben beber ms de un trago por da, y los hombres no deben beber ms de dos tragos por Training and development officer. Un trago es igual a:  12 onzas (355 ml) de cerveza  5 onzas de vino (150 ml) de vino  1,5onzas (23m) de bebidas espirituosas  No beba con el estmago vaco.  Mantngase hidratado. Beba agua, gaseosas dietticas o t helado sin azcar.  Las gaseosas comunes, los jugos y otros refrescos podran contener muchos carbohidratos y se dCivil Service fast streamer QU ALIMENTOS NO SE RECOMIENDAN? Cuando haga las elecciones de alimentos, es importante que recuerde que todos los alimentos son distintos. Algunos tienen menos nutrientes que otros por porcin, aunque podran tener la misma cantidad de caloras o carbohidratos. Es difcil darle al cuerpo lo que necesita cuando consume alimentos con menos nutrientes. Estos son algunos ejemplos de alimentos que debera evitar ya que contienen muchas caloras y carbohidratos, pero pocos nutrientes:  GPhysicist, medicaltrans (la mayora de los alimentos procesados incluyen grasas trans en la etiqueta de Informacin nutricional).  Gaseosas comunes.  Jugos.  Caramelos.  Dulces, como tortas, pasteles, rosquillas y [place]  Comidas fritas. QU ALIMENTOS PUEDO COMER? Consuma alimentos ricos en nutrientes, que nutrirn el cuerpo y lo mantendrn saludable. Los alimentos que debe comer tambin dependern de varios factores, como:  Las caloras que necesita.  Los medicamentos que toma.  Su peso.  El nivel de glucosa en sMarist College  El nArrow Rockde presin arterial.  El nivel de colesterol.  Debe consumir una amplia variedad de alimentos, por ejemplo:  Protenas.  Cortes de carne  magros.  Protenas con bajo contenido de grasas saturadas, como pescado, clara de huevo y frijoles. Evite las carnes procesadas.  Frutas y vegetales.  Frutas y vegetales que pueden ayudar a controlar los niveles sanguneos de glucosa, como manzanas, mangos y batatas.  Productos lcteos.  Elija productos lcteos sin grasa o con bajo contenido de grasa, como leche, yogur y queso.  Cereales, panes, pastas y arroz.  Elija cereales integrales, como panes multicereales, avena en grano y arroz integral. Estos alimentos pueden ayudar a controlar la presin arterial.  Grasas.  Alimentos que contengan grasas saludables, como frutos secos, aguacate, aceite de oliva, aceite de canola y pescado. TODOS LOS QUE PADECEN DIABETES MELLITUS TIENEN EL MISMO PLAN DE COMIDAS? Dado que todas las personas que padecen diabetes mellitus son distintas, no hay un solo plan de comidas que funcione para todos. Es muy importante que se rena con un nutricionista que lo ayudar a crear un plan de comidas adecuado para usted.   Esta informacin no tiene como fin reemplazar el consejo del mdico. Asegrese de hacerle al mdico cualquier pregunta que tenga.   Document Released: 08/12/2007 Document Revised: 05/26/2014 Elsevier Interactive Patient Education 2016 Elsevier Inc.  

## 2015-08-16 LAB — MICROALBUMIN, URINE: Microalb, Ur: 0.6 mg/dL

## 2015-09-26 MED FILL — metFORMIN HCL 1000 MG TABS: 1000 | 30 days supply | Qty: 60 | Fill #1

## 2015-09-26 MED FILL — glipiZIDE 10 MG TABS: 10 | 30 days supply | Qty: 120 | Fill #1

## 2015-10-02 ENCOUNTER — Other Ambulatory Visit: Payer: Self-pay | Admitting: Family Medicine

## 2015-10-02 DIAGNOSIS — Z1231 Encounter for screening mammogram for malignant neoplasm of breast: Secondary | ICD-10-CM

## 2015-10-18 ENCOUNTER — Ambulatory Visit
Admission: RE | Admit: 2015-10-18 | Discharge: 2015-10-18 | Disposition: A | Discharge status: Home / Self Care | Payer: No Typology Code available for payment source | Source: Ambulatory Visit | Attending: Family Medicine | Admitting: Family Medicine

## 2015-10-18 DIAGNOSIS — Z1231 Encounter for screening mammogram for malignant neoplasm of breast: Secondary | ICD-10-CM

## 2015-10-23 ENCOUNTER — Telehealth: Payer: Self-pay

## 2015-10-23 NOTE — Telephone Encounter (Signed)
-----   Message from Arnoldo Morale, MD sent at 10/23/2015 12:46 PM EDT ----- Please inform her that her mammogram shows no evidence of malignancy.

## 2015-10-23 NOTE — Telephone Encounter (Signed)
Writer called patient per Dr. Jarold Song using [place] , interpreter [phone].  Patient was told that her mammogram was normal and shows no evidence of malignancy.  Patient is to repeat her exam in one year per the recommendation.  Patient stated understanding.

## 2015-10-31 ENCOUNTER — Ambulatory Visit: Payer: No Typology Code available for payment source | Attending: Family Medicine

## 2015-11-07 MED FILL — ?GLIPIZIDE 10 MG TABLET: 10 | 30 days supply | Qty: 120 | Fill #5

## 2015-11-07 MED FILL — ?METFORMIN HCL 1,000 MG TAB: 1000 | 30 days supply | Qty: 60 | Fill #2

## 2015-12-12 ENCOUNTER — Encounter: Payer: Self-pay | Admitting: Family Medicine

## 2015-12-12 ENCOUNTER — Ambulatory Visit: Payer: No Typology Code available for payment source | Attending: Family Medicine | Admitting: Family Medicine

## 2015-12-12 VITALS — BP 134/81 | HR 60 | Temp 97.8°F | Ht <= 58 in | Wt 145.0 lb

## 2015-12-12 DIAGNOSIS — E162 Hypoglycemia, unspecified: Secondary | ICD-10-CM | POA: Insufficient documentation

## 2015-12-12 DIAGNOSIS — E118 Type 2 diabetes mellitus with unspecified complications: Secondary | ICD-10-CM

## 2015-12-12 DIAGNOSIS — I10 Essential (primary) hypertension: Secondary | ICD-10-CM | POA: Insufficient documentation

## 2015-12-12 DIAGNOSIS — Z79899 Other long term (current) drug therapy: Secondary | ICD-10-CM | POA: Insufficient documentation

## 2015-12-12 DIAGNOSIS — Z794 Long term (current) use of insulin: Secondary | ICD-10-CM | POA: Insufficient documentation

## 2015-12-12 DIAGNOSIS — E119 Type 2 diabetes mellitus without complications: Secondary | ICD-10-CM | POA: Insufficient documentation

## 2015-12-12 LAB — POCT GLYCOSYLATED HEMOGLOBIN (HGB A1C): HEMOGLOBIN A1C: 6.5

## 2015-12-12 LAB — GLUCOSE, POCT (MANUAL RESULT ENTRY): POC Glucose: 85 mg/dl (ref 70–99)

## 2015-12-12 MED ORDER — RAMIPRIL 2.5 MG PO CAPS: 2.5000 mg | ORAL_CAPSULE | Freq: Every day | ORAL | 3 refills | Status: DC

## 2015-12-12 MED ORDER — METFORMIN HCL 1000 MG PO TABS: 1000.0000 mg | ORAL_TABLET | Freq: Two times a day (BID) | ORAL | 3 refills | Status: DC

## 2015-12-12 MED ORDER — GLIPIZIDE 10 MG PO TABS: 20.0000 mg | ORAL_TABLET | Freq: Two times a day (BID) | ORAL | 3 refills | Status: DC

## 2015-12-12 MED FILL — glipiZIDE 10 MG TABS: 10 | 30 days supply | Qty: 120 | Fill #2

## 2015-12-12 MED FILL — RAMIPRIL 2.5 MG CAPSULE: 2.5 | 30 days supply | Qty: 30 | Fill #0

## 2015-12-12 MED FILL — ?METFORMIN HCL 1,000 MG TAB: 1000 | 30 days supply | Qty: 60 | Fill #3

## 2015-12-12 MED FILL — TRUE METRIX TEST STRIP: 25 days supply | Qty: 100 | Fill #1

## 2015-12-12 NOTE — Progress Notes (Signed)
"  irregular heart rate"

## 2015-12-12 NOTE — Patient Instructions (Signed)
Diabetes y actividad fsica (Diabetes and Exercise) Hacer actividad fsica con regularidad es muy importante. No se trata solo de perder peso. Tiene muchos otros beneficios, como por ejemplo:  Mejorar el estado fsico, la flexibilidad y la resistencia.  Aumenta la densidad sea.  Ayuda a controlar el peso.  Disminuye la grasa corporal.  Aumenta la fuerza muscular.  Reduce el estrs y las tensiones.  Mejora el estado de salud general. Las personas diabticas que realizan actividad fsica tienen beneficios adicionales debido al ejercicio:  Reduce el apetito.  El organismo mejora el uso del azcar (glucosa) de la sangre.  Ayuda a disminuir o controlar la glucosa en la sangre.  Disminuye la presin arterial.  Ayuda a disminuir los lpidos en la sangre (colesterol y triglicridos).  El organismo mejora el uso de la insulina porque:  Aumenta la sensibilidad del organismo a la insulina.  Reduce las necesidades de insulina del organismo.  Disminuye el riesgo de enfermedad cardaca por la actividad fsica ya que  disminuye el colesterol y tambin los triglicridos.  Aumenta los niveles de colesterol bueno (como las lipoprotenas de alta densidad [HDL]) en el organismo.  Disminuye los niveles de glucosa en la sangre. SU PLAN DE ACTIVIDAD  Elija una actividad que disfrute y establezca objetivos realistas. Para ejercitarse sin riesgos, debe comenzar a practicar cualquier actividad fsica nueva lentamente y aumentar la intensidad del ejercicio de forma gradual con el tiempo. Su mdico o educador en diabetes podrn ayudarlo a crear un plan de actividades que lo beneficie. Las recomendaciones generales incluyen lo siguiente:  Alentar a los nios para que realicen al menos 60 minutos de actividad fsica cada da.  Estirarse y realizar ejercicios de entrenamiento de la fuerza, como yoga o levantamiento de pesas, por lo menos 2 veces por semana.  Realizar en total por lo menos 150  minutos de ejercicios de intensidad moderada cada semana, como caminar a paso ligero o hacer gimnasia acutica.  Hacer ejercicio fsico por lo menos 3 das por semana y no dejar pasar ms de 2 das seguidos sin ejercitarse.  Evitar los perodos largos de inactividad (90 minutos o ms tiempo). Cuando deba pasar mucho tiempo sentado, haga pausas frecuentes para caminar o estirarse. RECOMENDACIONES PARA REALIZAR EJERCICIOS CUANDO SE TIENE DIABETES TIPO 1 O TIPO 2   Controle la glucosa en la sangre antes de comenzar. Si el nivel de glucosa en la sangre es de ms de 240 mg/dl, controle las cetonas en la orina. No haga actividad fsica si hay cetonas.  Evite inyectarse insulina en las zonas del cuerpo que ejercitar. Por ejemplo, evite inyectarse insulina en:  Los brazos, si juega al tenis.  Las piernas, si corre.  Lleve un registro de:  Los alimentos que consume antes y despus de hacer el ejercicio.  Los momentos esperables de picos de accin de la insulina.  Los niveles de glucosa en la sangre antes y despus de hacer ejercicios.  El tipo y cantidad de actividad fsica que realiza.  Revise los registros con su mdico. El mdico lo ayudar a desarrollar pautas para ajustar la cantidad de alimento y las cantidades de insulina antes y despus de hacer ejercicios.  Si toma insulina o agentes hipoglucemiantes por va oral, observe si hay signos y sntomas de hipoglucemia. Entre los que se incluyen:  Mareos.  Temblores.  Sudoracin.  Escalofros.  Confusin.  Beba gran cantidad de agua mientras hace ejercicios para evitar la deshidratacin o los golpes de calor. Durante la actividad fsica se pierde   agua corporal que se debe reponer.  Comente con su mdico antes de comenzar un programa de actividad fsica para verificar que sea seguro para usted. Recuerde, cualquier actividad es mejor que ninguna.   Esta informacin no tiene Marine scientist el consejo del mdico. Asegrese de  hacerle al mdico cualquier pregunta que tenga.   Document Released: 05/25/2007 Document Revised: 09/19/2014 Elsevier Interactive Patient Education [employer].

## 2015-12-12 NOTE — Progress Notes (Signed)
Subjective:  Patient ID: Alyssa Garrison, female    DOB: 21-Feb-1976  Age: 40 y.o. MRN: [id number]  CC: Diabetes and Irregular Heart Beat (patient reports "heart racing")   HPI Alyssa Garrison is a 40 year old female with a history of type 2 diabetes mellitus (A1c 6.5, hypertension who presents for diabetic follow-up visit.  She has had 2 episodes of hypoglycemia after having a light dinner with sugars in the 50s which improved with intake of Crixivan. Denies numbness in extremities. Last eye exam was last summer; refuses Pneumovax today.  She endorses periods of palpitations when she is under stress and this is usually associated with anxiety but denies presence of symptoms on a frequent basis.  Past Medical History:  Diagnosis Date  . Depression   . Diabetes mellitus without complication ([place])     History reviewed. No pertinent surgical history.  No Known Allergies   Outpatient Medications Prior to Visit  Medication Sig Dispense Refill  . aspirin 81 MG tablet Take 1 tablet (81 mg total) by mouth daily. 90 tablet 3  . Blood Glucose Monitoring Suppl (TRUE METRIX METER) W/DEVICE KIT 1 each by Does not apply route 2 (two) times daily before a meal. 1 kit 0  . Glucose Blood (TRUE METRIX BLOOD GLUCOSE TEST VI)   0  . glucose blood (TRUE METRIX BLOOD GLUCOSE TEST) test strip BID before meals 100 each 12  . glucose blood test strip Use as instructed 100 each 12  . glucose blood test strip   12  . glucose monitoring kit (FREESTYLE) monitoring kit 1 each by Does not apply route as needed for other. 1 each 2  . Lancets (FREESTYLE) lancets Use as instructed 100 each 12  . TRUEPLUS LANCETS 28G MISC 1 each by Does not apply route 2 (two) times daily before a meal. 100 each 11  . TRUETEST TEST test strip USE AS INSTRUCTED 100 each 12  . glipiZIDE (GLUCOTROL) 10 MG tablet Take 2 tablets (20 mg total) by mouth 2 (two) times daily before a meal. 60 tablet 3  . metFORMIN  (GLUCOPHAGE) 1000 MG tablet Take 1 tablet (1,000 mg total) by mouth 2 (two) times daily with a meal. 60 tablet 3  . ramipril (ALTACE) 2.5 MG capsule Take 1 capsule (2.5 mg total) by mouth daily. 30 capsule 3   No facility-administered medications prior to visit.     ROS Review of Systems  Constitutional: Negative for activity change and appetite change.  HENT: Negative for sinus pressure and sore throat.   Respiratory: Negative for chest tightness, shortness of breath and wheezing.   Cardiovascular: Positive for palpitations. Negative for chest pain.  Gastrointestinal: Negative for abdominal distention, abdominal pain and constipation.  Genitourinary: Negative.   Musculoskeletal: Negative.   Psychiatric/Behavioral: Negative for behavioral problems and dysphoric mood.    Objective:  BP 134/81 (BP Location: Right Arm, Patient Position: Sitting, Cuff Size: Small)   Pulse 60   Temp 97.8 F (36.6 C) (Oral)   Ht _0  (1.473 m)   Wt 145 lb (65.8 kg)   SpO2 100%   BMI 30.31 kg/m   BP/Weight 12/12/2015 08/15/2015 92/42/6834  Systolic BP 196 222 979  Diastolic BP 81 82 81  Wt. (Lbs) 145 146.8 151  BMI 30.31 29.63 33.26      Physical Exam  Constitutional: She is oriented to person, place, and time. She appears well-developed and well-nourished.  Cardiovascular: Normal rate, normal heart sounds and intact distal pulses.   No murmur  heard. Pulmonary/Chest: Effort normal and breath sounds normal. She has no wheezes. She has no rales. She exhibits no tenderness.  Abdominal: Soft. Bowel sounds are normal. She exhibits no distension and no mass. There is no tenderness.  Musculoskeletal: Normal range of motion.  Neurological: She is alert and oriented to person, place, and time.  Skin: Skin is warm.  Psychiatric: She has a normal mood and affect.    Lab Results  Component Value Date   HGBA1C 6.5 12/12/2015    CMP Latest Ref Rng & Units 08/15/2015 03/19/2015 01/11/2015  Glucose 65  - 99 mg/dL 158(H) 149(H) 129(H)  BUN 7 - 25 mg/dL _0 Creatinine 0.50 - 1.10 mg/dL 0.54 0.62 0.57  Sodium 135 - 146 mmol/L 139 141 136  Potassium 3.5 - 5.3 mmol/L 4.1 4.4 5.5(H)  Chloride 98 - 110 mmol/L 104 106 104  CO2 20 - 31 mmol/L _1 Calcium 8.6 - 10.2 mg/dL 9.2 9.6 9.5  Total Protein 6.1 - 8.1 g/dL 7.8 - 7.6  Total Bilirubin 0.2 - 1.2 mg/dL 0.5 - 0.8  Alkaline Phos 33 - 115 U/L 49 - 49  AST 10 - 30 U/L 18 - 13  ALT 6 - 29 U/L 13 - 9    Lipid Panel     Component Value Date/Time   CHOL 165 05/15/2015 0902   TRIG 131 05/15/2015 0902   HDL 56 05/15/2015 0902   CHOLHDL 2.9 05/15/2015 0902   VLDL 26 05/15/2015 0902   LDLCALC 83 05/15/2015 0902    Assessment & Plan:   1. Type 2 diabetes mellitus with complication, without long-term current use of insulin (HCC) Controlled with A1c of 6.5 Hypoglycemic episodes have been infrequent and so I will make no regimen changes at this time. She knows to contact the clinic in the event of hypoglycemia and we will need to cut down on the dose of glipizide. Advised to schedule annual eye exam Defers Pneumovax at next visit. - Glucose (CBG) - HgB A1c  2. Essential hypertension, benign Controlled - ramipril (ALTACE) 2.5 MG capsule; Take 1 capsule (2.5 mg total) by mouth daily.  Dispense: 30 capsule; Refill: 3  3. Type 2 diabetes mellitus without complication, without long-term current use of insulin (HCC) - glipiZIDE (GLUCOTROL) 10 MG tablet; Take 2 tablets (20 mg total) by mouth 2 (two) times daily before a meal.  Dispense: 60 tablet; Refill: 3 Metformin refilled   Meds ordered this encounter  Medications  . glipiZIDE (GLUCOTROL) 10 MG tablet    Sig: Take 2 tablets (20 mg total) by mouth 2 (two) times daily before a meal.    Dispense:  60 tablet    Refill:  3  . metFORMIN (GLUCOPHAGE) 1000 MG tablet    Sig: Take 1 tablet (1,000 mg total) by mouth 2 (two) times daily with a meal.    Dispense:  60 tablet    Refill:   3  . ramipril (ALTACE) 2.5 MG capsule    Sig: Take 1 capsule (2.5 mg total) by mouth daily.    Dispense:  30 capsule    Refill:  3    Follow-up: Return in about 3 months (around 03/13/2016) for follow up of Diabetes Mellitus.   Arnoldo Morale MD

## 2016-01-16 MED FILL — RAMIPRIL 2.5 MG CAPSULE: 2.5 | 30 days supply | Qty: 30 | Fill #1

## 2016-02-29 ENCOUNTER — Other Ambulatory Visit: Payer: Self-pay | Admitting: Family Medicine

## 2016-02-29 ENCOUNTER — Ambulatory Visit: Payer: No Typology Code available for payment source | Attending: Family Medicine

## 2016-02-29 MED FILL — metFORMIN HCL 1000 MG TABS: 1000 | 30 days supply | Qty: 60 | Fill #0

## 2016-02-29 MED FILL — glipiZIDE 10 MG TABS: 10 | 30 days supply | Qty: 120 | Fill #3

## 2016-02-29 MED FILL — TRUE METRIX TEST STRIP: 25 days supply | Qty: 100 | Fill #2

## 2016-02-29 MED FILL — RAMIPRIL 2.5 MG CAPSULE: 2.5 | 30 days supply | Qty: 30 | Fill #2

## 2016-04-04 ENCOUNTER — Ambulatory Visit: Payer: No Typology Code available for payment source | Admitting: Family Medicine

## 2016-05-30 ENCOUNTER — Ambulatory Visit (INDEPENDENT_AMBULATORY_CARE_PROVIDER_SITE_OTHER): Payer: Self-pay | Admitting: Internal Medicine

## 2016-05-30 ENCOUNTER — Encounter: Payer: Self-pay | Admitting: Internal Medicine

## 2016-05-30 VITALS — BP 130/80 | HR 74 | Resp 12 | Ht <= 58 in | Wt 152.0 lb

## 2016-05-30 DIAGNOSIS — I10 Essential (primary) hypertension: Secondary | ICD-10-CM

## 2016-05-30 DIAGNOSIS — R809 Proteinuria, unspecified: Secondary | ICD-10-CM

## 2016-05-30 DIAGNOSIS — E119 Type 2 diabetes mellitus without complications: Secondary | ICD-10-CM

## 2016-05-30 DIAGNOSIS — E118 Type 2 diabetes mellitus with unspecified complications: Secondary | ICD-10-CM

## 2016-05-30 MED ORDER — METFORMIN HCL 1000 MG PO TABS: 1000.0000 mg | ORAL_TABLET | Freq: Two times a day (BID) | ORAL | 11 refills | Status: DC

## 2016-05-30 MED ORDER — AGAMATRIX PRESTO W/DEVICE KIT: PACK | 0 refills | Status: AC

## 2016-05-30 MED ORDER — GLUCOSE BLOOD VI STRP: ORAL_STRIP | 12 refills | Status: AC

## 2016-05-30 MED ORDER — GLIPIZIDE 10 MG PO TABS: ORAL_TABLET | ORAL | 11 refills | Status: DC

## 2016-05-30 MED ORDER — LOSARTAN POTASSIUM 25 MG PO TABS: 25.0000 mg | ORAL_TABLET | Freq: Every day | ORAL | 11 refills | Status: DC

## 2016-05-30 NOTE — Progress Notes (Signed)
   Subjective:    Patient ID: Alyssa Garrison, female    DOB: September 12, 1975, 41 y.o.   MRN: [id number]  HPI   Here to establish  Previously followed at [facility] and [facility] not get an appt.  1.  DM:  Diagnosed 12 year ago.  Never used insulin.  Last A1C 6.5% in July.  Has been out of meds for 2 weeks.  Was stretching out meds before that.  Sugars have been running high, but was in low 100s when taking medication regularly.  Was weaned to glipizide 10 mg twice daily in October.  Continued on Metformin 1000 mg twice daily. Had cholesterol done in December and was fine, though LDL just slightly high at 83. Last eye exam:  2 years ago.  Has an appt. Next week in [facility], not [facility] clinic Microalbuminuria noted in 2016   2.  Essential Hypertension:  Diagnosed 2 years ago.  Was taking ramipril 2.5 mg.  Current Meds  Medication Sig  . aspirin 81 MG tablet Take 1 tablet (81 mg total) by mouth daily.  . TRUEPLUS LANCETS 28G MISC 1 each by Does not apply route 2 (two) times daily before a meal.  . [DISCONTINUED] Blood Glucose Monitoring Suppl (TRUE METRIX METER) W/DEVICE KIT 1 each by Does not apply route 2 (two) times daily before a meal.  . [DISCONTINUED] Glucose Blood (TRUE METRIX BLOOD GLUCOSE TEST VI)   . [DISCONTINUED] glucose blood (TRUE METRIX BLOOD GLUCOSE TEST) test strip BID before meals  . [DISCONTINUED] ramipril (ALTACE) 2.5 MG capsule Take 1 capsule (2.5 mg total) by mouth daily.   No Known Allergies   Past Medical History:  Diagnosis Date  . Depression   . Diabetes mellitus without complication ([place])    No past surgical history on file.   Family History  Problem Relation Age of Onset  . Diabetes Mother   . Diabetes Paternal Grandmother    Social History   Social History  . Marital status: Married    Spouse name: N/A  . Number of children: 2  . Years of education: 12   Occupational History  . Housewife    Social History Main Topics  .  Smoking status: Never Smoker  . Smokeless tobacco: Never Used  . Alcohol use 0.6 oz/week    1 Shots of liquor per week  . Drug use: No  . Sexual activity: Not on file   Other Topics Concern  . Not on file   Social History Narrative   Originally from [place]   Came to [place]. In 1998   Lives at home with husband and daughter, son         Review of Systems     Objective:   Physical Exam NAD HEENT:  PERRL, EOMI, discs sharp, TMs pearly gray, throat without injection Neck:  Supple, no adenopathy, no thyromegaly Chest:  CTA CV:  RRR with normal S1 and S2, No S3, S4 or murmur, radial and DP pulses normal and equal Abd:  S, NT, No HSM or mass, + BS LE:  No edema       Assessment & Plan:  1.  DM:  Restart meds Glipizide 10 mg twice daily and Metformnin 1000 mg twice daily with meals. Monitoring equipment orders to [place]  2.  Essential Hypertension vs microalbuminuria:  Switch to Cozaar for cost.25 mg daily at [facility] pharmacy.  Check labs in 3 months, once on meds without interruption.

## 2016-06-03 LAB — GLUCOSE, POCT (MANUAL RESULT ENTRY): POC Glucose: 365 mg/dl — AB (ref 70–99)

## 2016-08-28 ENCOUNTER — Encounter: Payer: Self-pay | Admitting: Internal Medicine

## 2016-08-28 ENCOUNTER — Ambulatory Visit (INDEPENDENT_AMBULATORY_CARE_PROVIDER_SITE_OTHER): Payer: Self-pay | Admitting: Internal Medicine

## 2016-08-28 VITALS — BP 150/90 | HR 88 | Resp 12 | Ht <= 58 in | Wt 157.0 lb

## 2016-08-28 DIAGNOSIS — Z79899 Other long term (current) drug therapy: Secondary | ICD-10-CM

## 2016-08-28 DIAGNOSIS — B351 Tinea unguium: Secondary | ICD-10-CM

## 2016-08-28 DIAGNOSIS — I10 Essential (primary) hypertension: Secondary | ICD-10-CM

## 2016-08-28 DIAGNOSIS — E118 Type 2 diabetes mellitus with unspecified complications: Secondary | ICD-10-CM

## 2016-08-28 DIAGNOSIS — Z23 Encounter for immunization: Secondary | ICD-10-CM

## 2016-08-28 HISTORY — DX: Tinea unguium: B35.1

## 2016-08-28 LAB — GLUCOSE, POCT (MANUAL RESULT ENTRY): POC Glucose: 171 mg/dl — AB (ref 70–99)

## 2016-08-28 MED ORDER — LOSARTAN POTASSIUM 50 MG PO TABS: ORAL_TABLET | ORAL | 11 refills | Status: DC

## 2016-08-28 MED ORDER — TERBINAFINE HCL 250 MG PO TABS: 250.0000 mg | ORAL_TABLET | Freq: Every day | ORAL | 0 refills | Status: DC

## 2016-08-28 NOTE — Patient Instructions (Addendum)
[place]  [facility] May D  A  C  AC 1  08:00 159   5:00  250   2 3 4    For foot and toenail fungus:  Spray your shoes with Lysol or Lotrimin Antifungal spray when you start the medication for your feet.   Re-spray your the shoes you wear with each wearing and allow to dry before wearing again You will need to continue to spray the shoes you have during the infection of your toenails and feet have been thrown out due to wear.  Once your toenails and feet are clear of infection, the shoes you buy new do not necessarily need to be sprayed. Clean your shower floor once to twice daily with bleach containing cleaner.  Use condoms every time you have sex

## 2016-08-28 NOTE — Progress Notes (Signed)
   Subjective:    Patient ID: Alyssa Garrison, female    DOB: Aug 15, 1975, 41 y.o.   MRN: [id number]  HPI   Nonfasting today.  1.  DM:   Sugars running 140s 3 hours postprandial in the evening and 180s before breakfast.   She did not bring in journal--not writing sugars down. Taking Glipizide 10 mg and Metformin 1000 mg twice daily with meals Denies missing medication. Did not get influenza vaccine this year. She has no documented pneumococcal vaccine in her chart. No eye check in past 2 years.   2.  Essential Hypertension:  Not well controlled with current dosing of Losartan.  Discussed as she shares she and her husband are using the rhythm method for birth control that that is not enough to prevent pregnancy while she is taking this medication.  Their youngest is 42 years old.   She states they will use condoms regularly.  She does not feel it will be a problem.     Current Meds  Medication Sig  . aspirin 81 MG tablet Take 1 tablet (81 mg total) by mouth daily.  . Blood Glucose Monitoring Suppl (AGAMATRIX PRESTO) w/Device KIT Check sugars twice daily before meals  . glipiZIDE (GLUCOTROL) 10 MG tablet 1 tab by mouth twice daily with meals  . glucose blood (AGAMATRIX PRESTO TEST) test strip Check sugars twice daily before meals  . losartan (COZAAR) 25 MG tablet Take 1 tablet (25 mg total) by mouth daily.  . metFORMIN (GLUCOPHAGE) 1000 MG tablet Take 1 tablet (1,000 mg total) by mouth 2 (two) times daily with a meal.  . TRUEPLUS LANCETS 28G MISC 1 each by Does not apply route 2 (two) times daily before a meal.    No Known Allergies       Review of Systems     Objective:   Physical Exam   NAD HEENT:  PERRL, EOMI, TMs pearly gray, throat without injection Neck:  Supple, No adenopathy Chest: CTA CV:  RRR without murmur or rub, radial and DP pulses normal and equal. Feet:  Great toenails with thickening and discoloration, white striations  superficially.    Diabetic Foot Exam - Simple   Simple Foot Form Diabetic Foot exam was performed with the following findings:  Yes 08/28/2016 12:29 PM  Visual Inspection No deformities, no ulcerations, no other skin breakdown bilaterally:  Yes Sensation Testing Intact to touch and monofilament testing bilaterally:  Yes Pulse Check Posterior Tibialis and Dorsalis pulse intact bilaterally:  Yes Comments Does have diffuse thickening and yellow discoloration of toenails.  Some scaling of posterior plantar foot.       Assessment & Plan:  1.  DM:  To keep track of sugars via journal and bring in with each visit.  A1C CMP urine microalbumin/crea Eye referral Pneumococcal 23 v vaccine  2.  Essential hypertension: States will use condoms regularly.  Need to continue to revisit this. Discussed concern damage to fetus medication can cause if gets pregnant. Increase Losartan to 50 mg daily.   3.  Toenail onychomycosis:  CMP Terbinafine 250 mg daily for 90 days. Hepatic profile in 6 and 12 weeks with followup.

## 2016-08-29 LAB — CBC WITH DIFFERENTIAL/PLATELET
BASOS: 0 %
Basophils Absolute: 0 10*3/uL (ref 0.0–0.2)
EOS (ABSOLUTE): 0.4 10*3/uL (ref 0.0–0.4)
EOS: 4 %
HEMATOCRIT: 37.7 % (ref 34.0–46.6)
Hemoglobin: 12.4 g/dL (ref 11.1–15.9)
Immature Grans (Abs): 0 10*3/uL (ref 0.0–0.1)
Immature Granulocytes: 0 %
LYMPHS ABS: 2.9 10*3/uL (ref 0.7–3.1)
Lymphs: 33 %
MCH: 28.1 pg (ref 26.6–33.0)
MCHC: 32.9 g/dL (ref 31.5–35.7)
MCV: 86 fL (ref 79–97)
MONOS ABS: 0.5 10*3/uL (ref 0.1–0.9)
Monocytes: 6 %
Neutrophils Absolute: 5 10*3/uL (ref 1.4–7.0)
Neutrophils: 57 %
Platelets: 327 10*3/uL (ref 150–379)
RBC: 4.41 x10E6/uL (ref 3.77–5.28)
RDW: 14.3 % (ref 12.3–15.4)
WBC: 8.8 10*3/uL (ref 3.4–10.8)

## 2016-08-29 LAB — COMPREHENSIVE METABOLIC PANEL
ALT: 22 IU/L (ref 0–32)
AST: 34 IU/L (ref 0–40)
Albumin/Globulin Ratio: 1.3 (ref 1.2–2.2)
Albumin: 4.7 g/dL (ref 3.5–5.5)
Alkaline Phosphatase: 59 IU/L (ref 39–117)
BUN/Creatinine Ratio: 18 (ref 9–23)
BUN: 10 mg/dL (ref 6–24)
Bilirubin Total: 0.4 mg/dL (ref 0.0–1.2)
CO2: 26 mmol/L (ref 18–29)
Calcium: 9.6 mg/dL (ref 8.7–10.2)
Chloride: 98 mmol/L (ref 96–106)
Creatinine, Ser: 0.55 mg/dL — ABNORMAL LOW (ref 0.57–1.00)
GFR calc Af Amer: 135 mL/min/{1.73_m2} (ref >59)
GFR calc non Af Amer: 117 mL/min/{1.73_m2} (ref >59)
Globulin, Total: 3.6 g/dL (ref 1.5–4.5)
Glucose: 134 mg/dL — ABNORMAL HIGH (ref 65–99)
Potassium: 4.4 mmol/L (ref 3.5–5.2)
Sodium: 140 mmol/L (ref 134–144)
Total Protein: 8.3 g/dL (ref 6.0–8.5)

## 2016-08-29 LAB — HGB A1C W/O EAG: Hgb A1c MFr Bld: 7.6 % — ABNORMAL HIGH (ref 4.8–5.6)

## 2016-08-29 LAB — MICROALBUMIN / CREATININE URINE RATIO
Creatinine, Urine: 20.9 mg/dL
Microalb/Creat Ratio: <14.4 mg/g creat (ref 0.0–30.0)
Microalbumin, Urine: <3 ug/mL

## 2016-10-10 ENCOUNTER — Encounter: Payer: Self-pay | Admitting: Internal Medicine

## 2016-10-16 ENCOUNTER — Other Ambulatory Visit (INDEPENDENT_AMBULATORY_CARE_PROVIDER_SITE_OTHER): Payer: Self-pay

## 2016-10-16 DIAGNOSIS — Z79899 Other long term (current) drug therapy: Secondary | ICD-10-CM

## 2016-10-17 LAB — HEPATIC FUNCTION PANEL
ALT: 15 IU/L (ref 0–32)
AST: 15 IU/L (ref 0–40)
Albumin: 4.4 g/dL (ref 3.5–5.5)
Alkaline Phosphatase: 61 IU/L (ref 39–117)
BILIRUBIN, DIRECT: 0.07 mg/dL (ref 0.00–0.40)
TOTAL PROTEIN: 7.6 g/dL (ref 6.0–8.5)

## 2016-10-23 ENCOUNTER — Encounter: Payer: Self-pay | Admitting: Internal Medicine

## 2016-10-23 ENCOUNTER — Ambulatory Visit (INDEPENDENT_AMBULATORY_CARE_PROVIDER_SITE_OTHER): Payer: Self-pay | Admitting: Internal Medicine

## 2016-10-23 VITALS — BP 122/80 | HR 74 | Resp 12 | Ht <= 58 in | Wt 154.0 lb

## 2016-10-23 DIAGNOSIS — I10 Essential (primary) hypertension: Secondary | ICD-10-CM

## 2016-10-23 DIAGNOSIS — B351 Tinea unguium: Secondary | ICD-10-CM

## 2016-10-23 DIAGNOSIS — R809 Proteinuria, unspecified: Secondary | ICD-10-CM

## 2016-10-23 DIAGNOSIS — E1129 Type 2 diabetes mellitus with other diabetic kidney complication: Secondary | ICD-10-CM

## 2016-10-23 MED ORDER — HYDROCHLOROTHIAZIDE 25 MG PO TABS: 25.0000 mg | ORAL_TABLET | Freq: Every day | ORAL | 11 refills | Status: DC

## 2016-10-23 NOTE — Progress Notes (Signed)
   Subjective:    Patient ID: Alyssa Garrison, female    DOB: 1975/05/24, 41 y.o.   MRN: [id number]  HPI   1.  Toenail onychomycosis:  Sees healthy nails at bases now.  Discussed hepatic profile is fine.  Tolerating Terbinafine fine.  Flaking and itching of feet has resolved as well. Spraying shoes and cleaning shower floor regularly as well.    2.   DM:  Brings in glucose journal.  Most sugars below 150 fasting, though sometimes in the evening forgets to check before starts eating and so some are above 200.  She did not mark when this specifically occurred.  3.  Essential Hypertension:  Taking 25 mg Losartan and tolerating well.  Patient was on Altace when established and switched to Losartan due to less cost.  She is not sure if she does not want to get pregnant in the near future.  She and her partner are using condoms regularly.  4.  RUQ pain:  Went to urgent care with [facility]. Unable to bring up record.  She was given what sounds like Omeprazole and the discomfort resolved.  Current Meds  Medication Sig  . aspirin 81 MG tablet Take 1 tablet (81 mg total) by mouth daily.  . Blood Glucose Monitoring Suppl (AGAMATRIX PRESTO) w/Device KIT Check sugars twice daily before meals  . glipiZIDE (GLUCOTROL) 10 MG tablet 1 tab by mouth twice daily with meals  . glucose blood (AGAMATRIX PRESTO TEST) test strip Check sugars twice daily before meals  . losartan (COZAAR) 50 MG tablet 1 tab by mouth daily (Patient taking differently: 1/2 tab by mouth daily)  . metFORMIN (GLUCOPHAGE) 1000 MG tablet Take 1 tablet (1,000 mg total) by mouth 2 (two) times daily with a meal.  . terbinafine (LAMISIL) 250 MG tablet Take 1 tablet (250 mg total) by mouth daily.  . TRUEPLUS LANCETS 28G MISC 1 each by Does not apply route 2 (two) times daily before a meal.   No Known Allergies    Review of Systems     Objective:   Physical Exam  NAD Lungs:  CTA CV:  RRR with normal S1 and S2, No S3, S4.  At first,  thought heard a soft blowing early systolic murmur in high LSB, but disappeared with listening further.  No radiation of extra cardiac noise.  Radial pulses normal and equal LE:  No edema:  Toenails, particularly great toenails with clearing at bases.        Assessment & Plan:  1.  Toenail onychomycosis:  Appears to be responding to Terbinafine.  Has appt. For follow up in mid July for final evaluation with hepatic profile.  2.  DM:  Sugars improved.  She is to notate when she forgets and is checking after eating and how long after eating so get a better idea of sugar control.  Recheck A1C with hepatic profile in July.  3.  Essential Hypertension/microalbuminuria:  Need to switch to medication not contraindicated with pregnancy until she is more definite with preventing any future pregnancy.  HCTZ 25 mg daily.  Follow up in 2 weeks for BMP and BP, pulse check.  4.  Likely gastritis due to resolution with PPI in May--urgent care visit.  Encouraged her to call clinic after hours if emergency in future.

## 2016-11-06 ENCOUNTER — Ambulatory Visit (INDEPENDENT_AMBULATORY_CARE_PROVIDER_SITE_OTHER): Payer: Self-pay

## 2016-11-06 VITALS — BP 122/70 | HR 76

## 2016-11-06 DIAGNOSIS — Z79899 Other long term (current) drug therapy: Secondary | ICD-10-CM

## 2016-11-06 NOTE — Progress Notes (Signed)
Informed patient to continue with treatment. Per Dr. Amil Amen she agreed.

## 2016-11-07 LAB — BASIC METABOLIC PANEL
BUN / CREAT RATIO: 17 (ref 9–23)
BUN: 9 mg/dL (ref 6–24)
CALCIUM: 9.5 mg/dL (ref 8.7–10.2)
CHLORIDE: 98 mmol/L (ref 96–106)
CO2: 23 mmol/L (ref 20–29)
CREATININE: 0.54 mg/dL — AB (ref 0.57–1.00)
GFR calc Af Amer: 136 mL/min/{1.73_m2} (ref >59)
GFR calc non Af Amer: 118 mL/min/{1.73_m2} (ref >59)
GLUCOSE: 166 mg/dL — AB (ref 65–99)
Potassium: 4.7 mmol/L (ref 3.5–5.2)
Sodium: 136 mmol/L (ref 134–144)

## 2016-11-21 ENCOUNTER — Ambulatory Visit: Payer: Self-pay | Admitting: Internal Medicine

## 2017-01-01 ENCOUNTER — Ambulatory Visit (INDEPENDENT_AMBULATORY_CARE_PROVIDER_SITE_OTHER): Payer: Self-pay | Admitting: Internal Medicine

## 2017-01-01 ENCOUNTER — Encounter: Payer: Self-pay | Admitting: Internal Medicine

## 2017-01-01 VITALS — BP 140/80 | HR 78 | Resp 12 | Ht <= 58 in | Wt 154.0 lb

## 2017-01-01 DIAGNOSIS — B351 Tinea unguium: Secondary | ICD-10-CM

## 2017-01-01 DIAGNOSIS — E1129 Type 2 diabetes mellitus with other diabetic kidney complication: Secondary | ICD-10-CM

## 2017-01-01 DIAGNOSIS — I10 Essential (primary) hypertension: Secondary | ICD-10-CM

## 2017-01-01 DIAGNOSIS — R809 Proteinuria, unspecified: Secondary | ICD-10-CM

## 2017-01-01 DIAGNOSIS — Z6834 Body mass index (BMI) 34.0-34.9, adult: Secondary | ICD-10-CM

## 2017-01-01 DIAGNOSIS — E6609 Other obesity due to excess calories: Secondary | ICD-10-CM

## 2017-01-01 NOTE — Patient Instructions (Addendum)
Tome un vaso de agua antes de cada comida Tome un minimo de 6 a 8 vasos de agua diarios Coma tres veces al dia Coma una proteina y Ardelia Mems grasa saludable con comida.  (huevos, pescado, pollo, pavo, y limite carnes rojas Coma 5 porciones diarias de legumbres.  Mezcle los colores Coma 2 porciones diarias de frutas con cascara cuando sea comestible Use platos pequeos Suelte su tenedor o cuchara despues de cada mordida hata que se mastique y se trague Come en la mesa con amigos o familiares por lo menos una vez al dia Apague la televisin y aparatos electrnicos durante la comida  Su objetivo debe ser perder Ardelia Mems libra por semana  Free flu vaccine at flu vaccine clinics:  September 20 8:30 to 11:30 a.m. And September 29th [facility] 1-4 p.m.  Both at [facility] next door to clinic

## 2017-01-01 NOTE — Progress Notes (Signed)
Subjective:    Patient ID: Alyssa Garrison, female    DOB: 11/11/1975, 40 y.o.   MRN: [id number]  HPI   1.  DM: worried as she is hungry all of the time, then other times, no appetite.  Generally hungry right after breakfast.  Discussed diet and physical activity at length. Due for A1C, was not quite at goal in April at 7.6% Sugars in morning run 130s and in evening 160-180  2.  Cholesterol:  Also due,  Nonfasting..  3.  Toenail onychomycosis.  Finished Terbinafine about 2-3 weeks ago.  Due for hepatic profile.  Still treating shoes and shower floor.   4.  Essential Hypertension:  controlled generally--up a bit today,however  Current Meds  Medication Sig  . aspirin 81 MG tablet Take 1 tablet (81 mg total) by mouth daily.  . Blood Glucose Monitoring Suppl (AGAMATRIX PRESTO) w/Device KIT Check sugars twice daily before meals  . glipiZIDE (GLUCOTROL) 10 MG tablet 1 tab by mouth twice daily with meals  . glucose blood (AGAMATRIX PRESTO TEST) test strip Check sugars twice daily before meals  . hydrochlorothiazide (HYDRODIURIL) 25 MG tablet Take 1 tablet (25 mg total) by mouth daily.  . metFORMIN (GLUCOPHAGE) 1000 MG tablet Take 1 tablet (1,000 mg total) by mouth 2 (two) times daily with a meal.  . TRUEPLUS LANCETS 28G MISC 1 each by Does not apply route 2 (two) times daily before a meal.    No Known Allergies     Review of Systems     Objective:   Physical Exam NAD Lungs:  CTA CV:  RRR without murmur or rub, radial and DP pulses normal and equal Abd:  S, NT, No HSM or mass, + BS LE: No edema.  Toenails with normal nail/clearing at bases          1.   DM:  Return for fasting labs in next week: A1C Discussed again lifestyle changes for patient and family, particularly diet.   Encouraged influenza vaccine with one of 2 clinics in September.  2.  Hyperlipidemia:  FLP in next week.  3.  Hypertension:  To work on lifestyle changes. No change to meds for now.  No  microalbuminuria with last check in May.  4.  Toenail onychomycosis:  To continue treatment of shoes and shower floor.  5.  Obesity:  As in #1 and 2 

## 2017-01-16 ENCOUNTER — Other Ambulatory Visit: Payer: Self-pay

## 2017-01-23 ENCOUNTER — Other Ambulatory Visit (INDEPENDENT_AMBULATORY_CARE_PROVIDER_SITE_OTHER): Payer: Self-pay

## 2017-01-23 DIAGNOSIS — E119 Type 2 diabetes mellitus without complications: Secondary | ICD-10-CM

## 2017-01-23 DIAGNOSIS — Z13220 Encounter for screening for lipoid disorders: Secondary | ICD-10-CM

## 2017-01-23 DIAGNOSIS — Z79899 Other long term (current) drug therapy: Secondary | ICD-10-CM

## 2017-01-24 LAB — COMPREHENSIVE METABOLIC PANEL
A/G RATIO: 1.2 (ref 1.2–2.2)
ALBUMIN: 4.1 g/dL (ref 3.5–5.5)
ALT: 12 IU/L (ref 0–32)
AST: 16 IU/L (ref 0–40)
Alkaline Phosphatase: 56 IU/L (ref 39–117)
BUN / CREAT RATIO: 16 (ref 9–23)
BUN: 9 mg/dL (ref 6–24)
Bilirubin Total: 0.3 mg/dL (ref 0.0–1.2)
CALCIUM: 9.3 mg/dL (ref 8.7–10.2)
CO2: 23 mmol/L (ref 20–29)
Chloride: 100 mmol/L (ref 96–106)
Creatinine, Ser: 0.55 mg/dL — ABNORMAL LOW (ref 0.57–1.00)
GFR, EST AFRICAN AMERICAN: 135 mL/min/{1.73_m2} (ref >59)
GFR, EST NON AFRICAN AMERICAN: 117 mL/min/{1.73_m2} (ref >59)
GLOBULIN, TOTAL: 3.3 g/dL (ref 1.5–4.5)
Glucose: 131 mg/dL — ABNORMAL HIGH (ref 65–99)
POTASSIUM: 4.7 mmol/L (ref 3.5–5.2)
SODIUM: 139 mmol/L (ref 134–144)
TOTAL PROTEIN: 7.4 g/dL (ref 6.0–8.5)

## 2017-01-24 LAB — LIPID PANEL W/O CHOL/HDL RATIO
CHOLESTEROL TOTAL: 165 mg/dL (ref 100–199)
HDL: 62 mg/dL (ref >39)
LDL Calculated: 89 mg/dL (ref 0–99)
TRIGLYCERIDES: 70 mg/dL (ref 0–149)
VLDL Cholesterol Cal: 14 mg/dL (ref 5–40)

## 2017-01-24 LAB — HGB A1C W/O EAG: Hgb A1c MFr Bld: 7.6 % — ABNORMAL HIGH (ref 4.8–5.6)

## 2017-03-08 ENCOUNTER — Encounter: Payer: Self-pay | Admitting: Internal Medicine

## 2017-03-08 DIAGNOSIS — B351 Tinea unguium: Secondary | ICD-10-CM

## 2017-03-08 HISTORY — DX: Tinea unguium: B35.1

## 2017-03-24 ENCOUNTER — Other Ambulatory Visit: Payer: Self-pay | Admitting: Internal Medicine

## 2017-03-24 DIAGNOSIS — Z1231 Encounter for screening mammogram for malignant neoplasm of breast: Secondary | ICD-10-CM

## 2017-03-31 ENCOUNTER — Telehealth: Payer: Self-pay | Admitting: Internal Medicine

## 2017-03-31 DIAGNOSIS — E119 Type 2 diabetes mellitus without complications: Secondary | ICD-10-CM

## 2017-03-31 MED ORDER — GLIPIZIDE 10 MG PO TABS: ORAL_TABLET | ORAL | 11 refills | Status: DC

## 2017-03-31 NOTE — Telephone Encounter (Signed)
RX sent to pharmacy. Antony Madura please notify patient

## 2017-03-31 NOTE — Telephone Encounter (Signed)
Patient call to rescheduled appointment on 04/02/17, appointment rescheduled for 05/15/17 @ 3pm. States her son is in ICU and she is unable to come in.   Patient would like to get  Refill fo glipiZIDE// (GLUCOTROL) 10 MG tablet [[id number]] and send to [facility] on [address]

## 2017-04-01 NOTE — Telephone Encounter (Signed)
Patient notified on 04/01/17.

## 2017-04-02 ENCOUNTER — Ambulatory Visit: Payer: Self-pay | Admitting: Internal Medicine

## 2017-05-15 ENCOUNTER — Ambulatory Visit: Payer: Self-pay | Admitting: Internal Medicine

## 2017-05-15 ENCOUNTER — Encounter: Payer: Self-pay | Admitting: Internal Medicine

## 2017-05-15 VITALS — BP 130/88 | HR 76 | Resp 12 | Ht <= 58 in | Wt 156.0 lb

## 2017-05-15 DIAGNOSIS — Z9189 Other specified personal risk factors, not elsewhere classified: Secondary | ICD-10-CM

## 2017-05-15 DIAGNOSIS — E119 Type 2 diabetes mellitus without complications: Secondary | ICD-10-CM

## 2017-05-15 DIAGNOSIS — E7800 Pure hypercholesterolemia, unspecified: Secondary | ICD-10-CM

## 2017-05-15 DIAGNOSIS — Z23 Encounter for immunization: Secondary | ICD-10-CM

## 2017-05-15 DIAGNOSIS — I10 Essential (primary) hypertension: Secondary | ICD-10-CM

## 2017-05-15 LAB — GLUCOSE, POCT (MANUAL RESULT ENTRY): POC Glucose: 143 mg/dl — AB (ref 70–99)

## 2017-05-15 NOTE — Patient Instructions (Signed)

## 2017-05-15 NOTE — Progress Notes (Signed)
   Subjective:    Patient ID: Alyssa Garrison, female    DOB: [birth year], 41 y.o.   MRN: [id number]  HPI   1.  DM:  Checking sugars regularly, but not writing down.  States her sugars have been running high.   She has not been at goal last 2 checks with A1C at 7.5% In the evening, her highest has been 280.  Generally 160-180 in the evening.  This is not before her evening meal, but about 2-3 hours later. In the morning, fasting, her sugars range 114-148, generally on the lower end of range. She has been going to many parties with the holidays.   Also, her bottle of glipizide should have been out 12 days ago and she has 2 tabs left in the bottle--cannot get a clear history of what she is doing to miss her Glipizide.  She keeps stating she may be taking too many of the Glipizide, though discussing that she feels she may be doubling up.  Discussed she is actually doing the opposite Also appears she missed a month of Metformin along the way as her prescription is about to expire and she has 2 refills left--will be able to fill only one of those before the expiration mid January.  Had eye check in April.  2. Dyslipidemia:  LDL was mildly high in September.  3.  Dental cleaning needed:  Needs referral to dentist.  4. Essential Hypertension:  Taking HCTZ regularly  Current Meds  Medication Sig  . aspirin 81 MG tablet Take 1 tablet (81 mg total) by mouth daily.  . Blood Glucose Monitoring Suppl (AGAMATRIX PRESTO) w/Device KIT Check sugars twice daily before meals  . glipiZIDE (GLUCOTROL) 10 MG tablet 1 tab by mouth twice daily with meals  . glucose blood (AGAMATRIX PRESTO TEST) test strip Check sugars twice daily before meals  . hydrochlorothiazide (HYDRODIURIL) 25 MG tablet Take 1 tablet (25 mg total) by mouth daily.  . metFORMIN (GLUCOPHAGE) 1000 MG tablet Take 1 tablet (1,000 mg total) by mouth 2 (two) times daily with a meal.    No Known Allergies        Review of Systems    Objective:   Physical Exam  NAD HEENT:  PERRL, EOMI, TMs pearly gray throat without injection.  Teeth in good repair. Neck:  Supple, No adenopathy, no thyromegaly Chest:  CTA CV:  RRR with normal S1 and S2, No S3, S4 or murmur.  Radial and DP pulses normal and equal LE:  No edema.        Assessment & Plan:  1.  DM:  Not controlled as would like to see.  Very long discussion regarding missing her meds.   She seems to think when her sugar is low after walking in the morning, she should not take her glipizide with her subsequent meal.   Would like for her to add protein in some way to her vegetable smoothie before exercise.  Discussed her higher sugars in the evening are a result of her skipping her morning meds. Will have her work on this the next 3 months and then recheck fasting labs, including A1C, urine microalbumin/crea  2.  Dyslipidemia:  To continue to work on above and FLP in 3 months.  3.  Hypertension:  Controlled  4.  Dental care:  Dental referral.  5.  HM:  Influenza vaccine.

## 2017-05-21 ENCOUNTER — Ambulatory Visit
Admission: RE | Admit: 2017-05-21 | Discharge: 2017-05-21 | Disposition: A | Discharge status: Home / Self Care | Payer: No Typology Code available for payment source | Source: Ambulatory Visit | Attending: Internal Medicine | Admitting: Internal Medicine

## 2017-05-21 DIAGNOSIS — Z1231 Encounter for screening mammogram for malignant neoplasm of breast: Secondary | ICD-10-CM

## 2017-05-28 ENCOUNTER — Other Ambulatory Visit: Payer: Self-pay

## 2017-05-28 DIAGNOSIS — E119 Type 2 diabetes mellitus without complications: Secondary | ICD-10-CM

## 2017-05-28 DIAGNOSIS — Z13220 Encounter for screening for lipoid disorders: Secondary | ICD-10-CM

## 2017-05-29 LAB — LIPID PANEL W/O CHOL/HDL RATIO
CHOLESTEROL TOTAL: 174 mg/dL (ref 100–199)
HDL: 64 mg/dL (ref >39)
LDL CALC: 96 mg/dL (ref 0–99)
Triglycerides: 71 mg/dL (ref 0–149)
VLDL CHOLESTEROL CAL: 14 mg/dL (ref 5–40)

## 2017-05-29 LAB — HGB A1C W/O EAG: HEMOGLOBIN A1C: 8.1 % — AB (ref 4.8–5.6)

## 2017-06-06 ENCOUNTER — Encounter: Payer: Self-pay | Admitting: Internal Medicine

## 2017-07-01 ENCOUNTER — Other Ambulatory Visit: Payer: Self-pay | Admitting: Internal Medicine

## 2017-07-01 ENCOUNTER — Telehealth: Payer: Self-pay | Admitting: Internal Medicine

## 2017-07-01 NOTE — Telephone Encounter (Signed)
Patient needs refill of metFORMIN (GLUCOPHAGE) 1000 mg tablet

## 2017-07-01 NOTE — Telephone Encounter (Signed)
Rx sent to pharmacy. Received Rx request from pharmacy.

## 2017-08-11 ENCOUNTER — Other Ambulatory Visit: Payer: Self-pay

## 2017-08-13 ENCOUNTER — Ambulatory Visit: Payer: Self-pay | Admitting: Internal Medicine

## 2017-08-28 ENCOUNTER — Other Ambulatory Visit (INDEPENDENT_AMBULATORY_CARE_PROVIDER_SITE_OTHER): Payer: Self-pay

## 2017-08-28 DIAGNOSIS — E119 Type 2 diabetes mellitus without complications: Secondary | ICD-10-CM

## 2017-08-28 DIAGNOSIS — E7800 Pure hypercholesterolemia, unspecified: Secondary | ICD-10-CM

## 2017-08-28 DIAGNOSIS — Z79899 Other long term (current) drug therapy: Secondary | ICD-10-CM

## 2017-08-29 LAB — COMPREHENSIVE METABOLIC PANEL
ALT: 19 IU/L (ref 0–32)
AST: 19 IU/L (ref 0–40)
Albumin/Globulin Ratio: 1.2 (ref 1.2–2.2)
Albumin: 4.3 g/dL (ref 3.5–5.5)
Alkaline Phosphatase: 61 IU/L (ref 39–117)
BUN/Creatinine Ratio: 15 (ref 9–23)
BUN: 8 mg/dL (ref 6–24)
Bilirubin Total: 0.4 mg/dL (ref 0.0–1.2)
CALCIUM: 9 mg/dL (ref 8.7–10.2)
CO2: 22 mmol/L (ref 20–29)
Chloride: 103 mmol/L (ref 96–106)
Creatinine, Ser: 0.55 mg/dL — ABNORMAL LOW (ref 0.57–1.00)
GFR, EST AFRICAN AMERICAN: 134 mL/min/{1.73_m2} (ref >59)
GFR, EST NON AFRICAN AMERICAN: 116 mL/min/{1.73_m2} (ref >59)
GLOBULIN, TOTAL: 3.6 g/dL (ref 1.5–4.5)
Glucose: 163 mg/dL — ABNORMAL HIGH (ref 65–99)
Potassium: 4.8 mmol/L (ref 3.5–5.2)
SODIUM: 141 mmol/L (ref 134–144)
TOTAL PROTEIN: 7.9 g/dL (ref 6.0–8.5)

## 2017-08-29 LAB — HGB A1C W/O EAG: HEMOGLOBIN A1C: 7.7 % — AB (ref 4.8–5.6)

## 2017-08-29 LAB — MICROALBUMIN / CREATININE URINE RATIO
CREATININE, UR: 165.7 mg/dL
MICROALB/CREAT RATIO: 3.3 mg/g{creat} (ref 0.0–30.0)
MICROALBUM., U, RANDOM: 5.4 ug/mL

## 2017-08-29 LAB — LIPID PANEL W/O CHOL/HDL RATIO
CHOLESTEROL TOTAL: 164 mg/dL (ref 100–199)
HDL: 63 mg/dL (ref >39)
LDL Calculated: 84 mg/dL (ref 0–99)
Triglycerides: 84 mg/dL (ref 0–149)
VLDL Cholesterol Cal: 17 mg/dL (ref 5–40)

## 2017-09-03 ENCOUNTER — Encounter: Payer: Self-pay | Admitting: Internal Medicine

## 2017-09-03 ENCOUNTER — Ambulatory Visit: Payer: Self-pay | Admitting: Internal Medicine

## 2017-09-03 VITALS — BP 136/80 | HR 80 | Resp 12 | Ht <= 58 in | Wt 158.0 lb

## 2017-09-03 DIAGNOSIS — E7800 Pure hypercholesterolemia, unspecified: Secondary | ICD-10-CM

## 2017-09-03 DIAGNOSIS — I10 Essential (primary) hypertension: Secondary | ICD-10-CM

## 2017-09-03 DIAGNOSIS — Z6834 Body mass index (BMI) 34.0-34.9, adult: Secondary | ICD-10-CM

## 2017-09-03 DIAGNOSIS — E6609 Other obesity due to excess calories: Secondary | ICD-10-CM

## 2017-09-03 DIAGNOSIS — E1129 Type 2 diabetes mellitus with other diabetic kidney complication: Secondary | ICD-10-CM

## 2017-09-03 DIAGNOSIS — R809 Proteinuria, unspecified: Secondary | ICD-10-CM

## 2017-09-03 NOTE — Progress Notes (Signed)
Subjective:    Patient ID: Alyssa Garrison, female    DOB: 12/05/1975, 42 y.o.   MRN: [id number]  HPI   1. DM:  Did not bring in sugar journal.  Did not bring in medications.  Was holding Glipizide last visit, because she though her sugar would drop when she went for a walk.  Has gained 2 lbs since last visit.    Physical activity 1 hour in house--different exercises to music.  Not eating well during weekends. Eating a lot of tortillas over the weekend with beef.   Due for eye appt with eye specialist in [place] next month.  The office reminds her.  Has 2 children who are also overweight. They have been told to lose weight.  Husband is thin and brings bad food into the house. Kids don't want to eat her cooking now they have school lunches, which are generally not healthy.  2.  Elevated LDL:  Improved, though not quite at goal at 84.  Discussed goal is below 70. Lipid Panel     Component Value Date/Time   CHOL 164 08/28/2017 0843   TRIG 84 08/28/2017 0843   HDL 63 08/28/2017 0843   CHOLHDL 2.9 05/15/2015 0902   VLDL 26 05/15/2015 0902   LDLCALC 84 08/28/2017 0843    3.  Essential Hypertension:  Taking HCTZ  Current Meds  Medication Sig  . aspirin 81 MG tablet Take 1 tablet (81 mg total) by mouth daily.  . Blood Glucose Monitoring Suppl (AGAMATRIX PRESTO) w/Device KIT Check sugars twice daily before meals  . glipiZIDE (GLUCOTROL) 10 MG tablet 1 tab by mouth twice daily with meals  . glucose blood (AGAMATRIX PRESTO TEST) test strip Check sugars twice daily before meals  . hydrochlorothiazide (HYDRODIURIL) 25 MG tablet Take 1 tablet (25 mg total) by mouth daily.  . metFORMIN (GLUCOPHAGE) 1000 MG tablet TAKE ONE TABLET BY MOUTH TWICE DAILY WITH A MEAL    No Known Allergies   Review of Systems     Objective:   Physical Exam NAD, though tearful when discussing her children's weight and health Chest:  CTA CV:  RRR without murmur or rub, radial pulses normal  and equal.   Abd:  S, NT, NO HSM or mass, + BS LE:  No edema  Diabetic Foot Exam - Simple   Simple Foot Form Diabetic Foot exam was performed with the following findings:  Yes 09/03/2017 10:48 AM  Visual Inspection No deformities, no ulcerations, no other skin breakdown bilaterally:  Yes Sensation Testing Intact to touch and monofilament testing bilaterally:  Yes Pulse Check Posterior Tibialis and Dorsalis pulse intact bilaterally:  Yes Comments          Assessment & Plan:  1.  DM:  Moving in right direction, but not yet at goal  2.  Elevated LDL:  Also moving in correct direction, but needs continued work with lifestyle changes.  Discussed would be happy to speak with husband and children for all in family to work on having healthy foods only at home.   3.  Essential hypertension:  Fair control.  Would like to see in 120/70 range.  Patient again to work with family on diet and increasing physical activity.  Return in 3 months for CPE with pap--none for 3 years.  Mammogram done in January 

## 2017-09-03 NOTE — Patient Instructions (Signed)

## 2017-09-29 ENCOUNTER — Other Ambulatory Visit: Payer: Self-pay

## 2017-10-29 ENCOUNTER — Other Ambulatory Visit: Payer: Self-pay | Admitting: Internal Medicine

## 2017-12-24 ENCOUNTER — Encounter: Payer: Self-pay | Admitting: Internal Medicine

## 2017-12-24 ENCOUNTER — Ambulatory Visit: Payer: Self-pay | Admitting: Internal Medicine

## 2017-12-24 VITALS — BP 130/80 | HR 70 | Resp 12 | Ht <= 58 in | Wt 146.0 lb

## 2017-12-24 DIAGNOSIS — E1129 Type 2 diabetes mellitus with other diabetic kidney complication: Secondary | ICD-10-CM

## 2017-12-24 DIAGNOSIS — Z0000 Encounter for general adult medical examination without abnormal findings: Secondary | ICD-10-CM

## 2017-12-24 DIAGNOSIS — Z124 Encounter for screening for malignant neoplasm of cervix: Secondary | ICD-10-CM

## 2017-12-24 DIAGNOSIS — I10 Essential (primary) hypertension: Secondary | ICD-10-CM

## 2017-12-24 DIAGNOSIS — R809 Proteinuria, unspecified: Secondary | ICD-10-CM

## 2017-12-24 NOTE — Progress Notes (Signed)
Subjective:    Patient ID: Alyssa Garrison, female    DOB: Jul 23, 1975, 42 y.o.   MRN: [id number]  HPI   CPE with pap  1.  Pap: Last pap was 05/03/2015.  Always normal.  No family history of cervical cancer.  2.  Mammogram:  Last mammogram 05/21/2017 and also normal.  Always normal.  No family history of breast cancer.    3.  Osteoprevention:  No daily intake of dairy products.  She is currently involved in a daily "boot camp"  Has been doing this since July.  She plans to continue indefinitely.  45 minutes daily.  4.  Guaiac Cards:  Never.    5.  Colonoscopy:  Never.  No family history of colon cancer.  6.  Immunizations:   Immunization History  Administered Date(s) Administered  . Influenza Inj Mdck Quad Pf 05/15/2017  . Influenza Split 01/24/2013  . Influenza Whole 03/14/2008  . Influenza,inj,Quad PF,6+ Mos 06/16/2014, 04/03/2015  . Pneumococcal Polysaccharide-23 08/28/2016  . Tdap 05/03/2015    7.  Glucose/Cholesterol:  Has known DM and has lost 12 lbs since last visit, but sugars still no less than 170 fasting and often in low to mid 200s.  She is maxed on Glipizide and Metformin.   Was stressed as her in laws were visiting for 2 months.  They left 1 week ago and is feeling better.   Her cholesterol panel is near goal with all without medication.    Lipid Panel     Component Value Date/Time   CHOL 164 08/28/2017 0843   TRIG 84 08/28/2017 0843   HDL 63 08/28/2017 0843   CHOLHDL 2.9 05/15/2015 0902   VLDL 26 05/15/2015 0902   LDLCALC 84 08/28/2017 0843   Current Meds  Medication Sig  . aspirin 81 MG tablet Take 1 tablet (81 mg total) by mouth daily.  . Blood Glucose Monitoring Suppl (AGAMATRIX PRESTO) w/Device KIT Check sugars twice daily before meals  . glucose blood (AGAMATRIX PRESTO TEST) test strip Check sugars twice daily before meals  . hydrochlorothiazide (HYDRODIURIL) 25 MG tablet TAKE 1 TABLET BY MOUTH  DAILY  . metFORMIN (GLUCOPHAGE) 1000 MG tablet  TAKE ONE TABLET BY MOUTH TWICE DAILY WITH A MEAL  . [DISCONTINUED] glipiZIDE (GLUCOTROL) 10 MG tablet 1 tab by mouth twice daily with meals    No Known Allergies   Past Medical History:  Diagnosis Date  . Depression   . Diabetes mellitus without complication ([place])   . Essential hypertension, benign 08/22/2013  . Onychomycosis 03/08/2017  . Onychomycosis of toenail 08/28/2016    History reviewed. No pertinent surgical history.   Family History  Problem Relation Age of Onset  . Diabetes Mother   . Hyperlipidemia Mother   . Hypertension Mother   . Diabetes Paternal Grandmother   . Diabetes Father   . Autism spectrum disorder Son   . ADD / ADHD Son   . Breast cancer Neg Hx     Social History   Socioeconomic History  . Marital status: Married    Spouse name: Not on file  . Number of children: 2  . Years of education: 70  . Highest education level: Not on file  Occupational History  . Occupation: Housewife    Comment: One day out of week works in a [employer]  . Financial resource strain: Not on file  . Food insecurity:    Worry: Never true    Inability: Never true  .  Transportation needs:    Medical: No    Non-medical: No  Tobacco Use  . Smoking status: Never Smoker  . Smokeless tobacco: Never Used  Substance and Sexual Activity  . Alcohol use: Yes    Alcohol/week: 1.0 standard drinks    Types: 1 Shots of liquor per week  . Drug use: No  . Sexual activity: Yes    Birth control/protection: Condom    Comment: every time  Lifestyle  . Physical activity:    Days per week: 7 days    Minutes per session: 50 min  . Stress: Rather much  Relationships  . Social connections:    Talks on phone: Not on file    Gets together: Not on file    Attends religious service: Not on file    Active member of club or organization: Not on file    Attends meetings of clubs or organizations: Not on file    Relationship status: Not on file  . Intimate partner  violence:    Fear of current or ex partner: No    Emotionally abused: No    Physically abused: No    Forced sexual activity: No  Other Topics Concern  . Not on file  Social History Narrative   Came to [place] In [place]   Lives at home with husband and daughter, son       Review of Systems  Respiratory: Negative for cough and shortness of breath.   Cardiovascular: Negative for chest pain.  Gastrointestinal: Negative for abdominal pain and blood in stool.  Genitourinary: Negative for dysuria.       Objective:   Physical Exam  Constitutional: She is oriented to person, place, and time. She appears well-developed and well-nourished.  HENT:  Head: Normocephalic and atraumatic.  Right Ear: Hearing, tympanic membrane, external ear and ear canal normal.  Left Ear: Hearing, tympanic membrane, external ear and ear canal normal.  Nose: Nose normal.  Mouth/Throat: Uvula is midline, oropharynx is clear and moist and mucous membranes are normal.  Eyes: Pupils are equal, round, and reactive to light. Conjunctivae and EOM are normal.  Neck: Normal range of motion and full passive range of motion without pain. Neck supple. No thyromegaly present.  Cardiovascular: Normal rate, regular rhythm, S1 normal and S2 normal. Exam reveals no S3, no S4 and no friction rub.  No murmur heard. Pulses:      Dorsalis pedis pulses are 2+ on the right side, and 2+ on the left side.       Posterior tibial pulses are 2+ on the right side, and 2+ on the left side.  No carotid bruits.  Carotid, radial, femoral, DP and PT pulses normal and equal.   Pulmonary/Chest: Effort normal and breath sounds normal. Right breast exhibits no inverted nipple, no mass, no nipple discharge, no skin change and no tenderness. Left breast exhibits no inverted nipple, no mass, no nipple discharge, no skin change and no tenderness.  Abdominal: Soft. Bowel sounds are normal. She exhibits no mass. There is no hepatosplenomegaly. There is no  tenderness. No hernia.  Genitourinary:  Genitourinary Comments: Normal external female genitalia No cervical or vaginal inflammation or discharge.  Cervix facing posteriorly and patient with difficulty relaxing to be able to see entire cervical face. No uterine or adnexal mass or tenderness Rectal:  Heme negative light brown stool, no mass.    Musculoskeletal: Normal range of motion.       Right foot: There is no deformity.  Left foot: There is no deformity.  Feet:  Right Foot:  Protective Sensation: 10 sites tested. 10 sites sensed.  Skin Integrity: Negative for ulcer, skin breakdown or callus.  Left Foot:  Protective Sensation: 10 sites tested. 10 sites sensed.  Skin Integrity: Negative for ulcer, skin breakdown or callus.  Lymphadenopathy:       Head (right side): No submental and no submandibular adenopathy present.       Head (left side): No submental and no submandibular adenopathy present.    She has no cervical adenopathy.    She has no axillary adenopathy.       Right: No inguinal and no supraclavicular adenopathy present.       Left: No inguinal and no supraclavicular adenopathy present.  Neurological: She is alert and oriented to person, place, and time. She has normal strength and normal reflexes. No cranial nerve deficit or sensory deficit. Coordination and gait normal.  Skin: Skin is warm. Capillary refill takes less than 2 seconds. No rash noted.  Psychiatric: She has a normal mood and affect. Her speech is normal and behavior is normal. Judgment and thought content normal. Cognition and memory are normal.          Assessment & Plan:  1.  CPE with pap Not due for mammogram until January 2020. Guaiac cards x 3 to return in 2 weeks. Influenza vaccine at one of our clinics with orange card sign up in September/Oct.  2. DM:  Check A1C and if not improved consider add Januvia. May wait for a month to see if decreased stress allows for better sugars.  3.   Hypertension:   Fair control

## 2017-12-25 LAB — HGB A1C W/O EAG: HEMOGLOBIN A1C: 9 % — AB (ref 4.8–5.6)

## 2017-12-28 LAB — CYTOLOGY - PAP

## 2018-01-06 MED ORDER — SITAGLIPTIN PHOSPHATE 100 MG PO TABS: ORAL_TABLET | ORAL | 11 refills | Status: DC

## 2018-01-15 ENCOUNTER — Ambulatory Visit: Payer: Self-pay | Admitting: Internal Medicine

## 2018-01-15 ENCOUNTER — Encounter: Payer: Self-pay | Admitting: Internal Medicine

## 2018-01-15 VITALS — BP 128/78 | HR 60 | Resp 12 | Ht <= 58 in | Wt 147.0 lb

## 2018-01-15 DIAGNOSIS — M546 Pain in thoracic spine: Secondary | ICD-10-CM

## 2018-01-15 MED ORDER — IBUPROFEN 200 MG PO TABS: ORAL_TABLET | ORAL | 0 refills | Status: DC

## 2018-01-15 MED ORDER — CYCLOBENZAPRINE HCL 10 MG PO TABS: ORAL_TABLET | ORAL | 0 refills | Status: DC

## 2018-01-15 NOTE — Patient Instructions (Signed)
Stretches after warm pack twice daily

## 2018-01-15 NOTE — Progress Notes (Signed)
   Subjective:    Patient ID: Alyssa Garrison, female    DOB: 1975/08/16, 42 y.o.   MRN: [id number]  HPI   Left upper back pain for 6 days. Pain in mid thoracic back and medial to scapula.   Just noted the pain as she started her usual activities during the day it started.   She does not recall an injury or overuse prior to the pain. Later, states she was lifting weights with exercise in the days preceding and may have set this off. The pain has worsened with time. Hurts to take a deep breath--she is now also having discomfort radiating from the back pain to her chest.  Twisting movements of her torso make the pain worse.  Stretching helps.  Current Meds  Medication Sig  . aspirin 81 MG tablet Take 1 tablet (81 mg total) by mouth daily.  . Blood Glucose Monitoring Suppl (AGAMATRIX PRESTO) w/Device KIT Check sugars twice daily before meals  . glipiZIDE (GLUCOTROL) 10 MG tablet 1 tab by mouth twice daily with meals  . glucose blood (AGAMATRIX PRESTO TEST) test strip Check sugars twice daily before meals  . hydrochlorothiazide (HYDRODIURIL) 25 MG tablet TAKE 1 TABLET BY MOUTH  DAILY  . metFORMIN (GLUCOPHAGE) 1000 MG tablet TAKE ONE TABLET BY MOUTH TWICE DAILY WITH A MEAL   Working on paperwork for Januvia for [facility]  Review of Systems     Objective:   Physical Exam Mild discomfort Neck:  Mild tenderness over C7 spinous process.  Quite tender over paraspinous musculature medial to left scapula.  Muscle here is tight.   Chest:  CTA. Mild tenderness over left parasternal area. CV: RRR without murmur or rub. Neuro:  UE:  Motor 5/5.       Assessment & Plan:  1.  Left thoracic muscle spasm:  Went over warm packing and subsequent stretches.  Tennis ball in sock against muscle involved while in car seat for massage.   Ibuprofen 400-600 mg twice daily as needed Cyclobenzaprine 5 to 10 mg at bedtime Avoid exacerbating activities such as weight lifting for now.

## 2018-01-20 ENCOUNTER — Other Ambulatory Visit (INDEPENDENT_AMBULATORY_CARE_PROVIDER_SITE_OTHER): Payer: Self-pay

## 2018-01-20 DIAGNOSIS — Z1211 Encounter for screening for malignant neoplasm of colon: Secondary | ICD-10-CM

## 2018-01-20 LAB — POC HEMOCCULT BLD/STL (HOME/3-CARD/SCREEN)
Card #2 Fecal Occult Blod, POC: NEGATIVE
FECAL OCCULT BLD: NEGATIVE
Fecal Occult Blood, POC: NEGATIVE

## 2018-03-29 ENCOUNTER — Other Ambulatory Visit: Payer: Self-pay

## 2018-03-30 ENCOUNTER — Other Ambulatory Visit: Payer: Self-pay

## 2018-03-30 DIAGNOSIS — R809 Proteinuria, unspecified: Secondary | ICD-10-CM

## 2018-03-30 DIAGNOSIS — E1129 Type 2 diabetes mellitus with other diabetic kidney complication: Secondary | ICD-10-CM

## 2018-03-31 ENCOUNTER — Other Ambulatory Visit: Payer: Self-pay | Admitting: Internal Medicine

## 2018-03-31 DIAGNOSIS — E119 Type 2 diabetes mellitus without complications: Secondary | ICD-10-CM

## 2018-03-31 LAB — HGB A1C W/O EAG: Hgb A1c MFr Bld: 7.7 % — ABNORMAL HIGH (ref 4.8–5.6)

## 2018-04-01 ENCOUNTER — Encounter: Payer: Self-pay | Admitting: Internal Medicine

## 2018-04-01 ENCOUNTER — Ambulatory Visit: Payer: Self-pay | Admitting: Internal Medicine

## 2018-04-01 VITALS — BP 130/80 | HR 68 | Resp 12 | Ht <= 58 in | Wt 144.0 lb

## 2018-04-01 DIAGNOSIS — R809 Proteinuria, unspecified: Secondary | ICD-10-CM

## 2018-04-01 DIAGNOSIS — E1129 Type 2 diabetes mellitus with other diabetic kidney complication: Secondary | ICD-10-CM

## 2018-04-01 DIAGNOSIS — Z23 Encounter for immunization: Secondary | ICD-10-CM

## 2018-04-01 LAB — GLUCOSE, POCT (MANUAL RESULT ENTRY): POC GLUCOSE: 230 mg/dL — AB (ref 70–99)

## 2018-04-01 NOTE — Progress Notes (Signed)
   Subjective:    Patient ID: Alyssa Garrison, female    DOB: 09/20/1975, 42 y.o.   MRN: [id number]  HPI  1.  DM: Did not notify clinic she has been unable to obtain Januvia as her ID has expired.  She has an appt to get that soon.  Was unable to apply at [facility] without the ID.   In the meantime, she has changed her diet with decreased carbohydrates and increased physical activity and A1C now down to 7.7% from 9.0%.  Using weights. Calisthenics with a boot camp and walking.  Has lost 3 lbs since last visit.   Admits to not eating in a good way, but exercising previously Has an eye exam on 26th of November.  Current Meds  Medication Sig  . aspirin 81 MG tablet Take 1 tablet (81 mg total) by mouth daily.  . Blood Glucose Monitoring Suppl (AGAMATRIX PRESTO) w/Device KIT Check sugars twice daily before meals  . glipiZIDE (GLUCOTROL) 10 MG tablet TAKE 1 TABLET BY MOUTH TWICE DAILY WITH MEALS  . glucose blood (AGAMATRIX PRESTO TEST) test strip Check sugars twice daily before meals  . hydrochlorothiazide (HYDRODIURIL) 25 MG tablet TAKE 1 TABLET BY MOUTH  DAILY  . metFORMIN (GLUCOPHAGE) 1000 MG tablet TAKE ONE TABLET BY MOUTH TWICE DAILY WITH A MEAL    No Known Allergies  Review of Systems     Objective:   Physical Exam NAD Lungs:  CTA CV:  RRR without murmur or rub.  Radial pulses normal and equal      Assessment & Plan:  1.  DM:  Improved control with lifestyle changes.  Will hold on addition of Januvia for now and recheck FLP/A1C in 3 months.  If continues with improvement, no new meds. Eye exam later this month.  2.  HM:  Flu vaccine.

## 2018-07-02 ENCOUNTER — Other Ambulatory Visit: Payer: Self-pay

## 2018-07-02 ENCOUNTER — Other Ambulatory Visit: Payer: Self-pay | Admitting: Internal Medicine

## 2018-07-02 DIAGNOSIS — R809 Proteinuria, unspecified: Secondary | ICD-10-CM

## 2018-07-02 DIAGNOSIS — E7800 Pure hypercholesterolemia, unspecified: Secondary | ICD-10-CM

## 2018-07-02 DIAGNOSIS — E1129 Type 2 diabetes mellitus with other diabetic kidney complication: Secondary | ICD-10-CM

## 2018-07-03 LAB — HGB A1C W/O EAG: Hgb A1c MFr Bld: 7.4 % — ABNORMAL HIGH (ref 4.8–5.6)

## 2018-07-03 LAB — LIPID PANEL W/O CHOL/HDL RATIO
Cholesterol, Total: 173 mg/dL (ref 100–199)
HDL: 49 mg/dL (ref >39)
LDL Calculated: 108 mg/dL — ABNORMAL HIGH (ref 0–99)
TRIGLYCERIDES: 82 mg/dL (ref 0–149)
VLDL Cholesterol Cal: 16 mg/dL (ref 5–40)

## 2018-07-08 ENCOUNTER — Ambulatory Visit: Payer: Self-pay | Admitting: Internal Medicine

## 2018-07-08 ENCOUNTER — Encounter: Payer: Self-pay | Admitting: Internal Medicine

## 2018-07-08 VITALS — BP 130/80 | HR 70 | Resp 12 | Ht <= 58 in | Wt 145.0 lb

## 2018-07-08 DIAGNOSIS — E6609 Other obesity due to excess calories: Secondary | ICD-10-CM

## 2018-07-08 DIAGNOSIS — R809 Proteinuria, unspecified: Principal | ICD-10-CM

## 2018-07-08 DIAGNOSIS — I10 Essential (primary) hypertension: Secondary | ICD-10-CM

## 2018-07-08 DIAGNOSIS — Z6834 Body mass index (BMI) 34.0-34.9, adult: Secondary | ICD-10-CM

## 2018-07-08 DIAGNOSIS — E7800 Pure hypercholesterolemia, unspecified: Secondary | ICD-10-CM

## 2018-07-08 DIAGNOSIS — E1129 Type 2 diabetes mellitus with other diabetic kidney complication: Secondary | ICD-10-CM

## 2018-07-08 NOTE — Patient Instructions (Addendum)
Tome un vaso de agua antes de cada comida Tome un minimo de 6 a 8 vasos de agua diarios Coma tres veces al dia Coma una proteina y Ardelia Mems grasa saludable con comida.  (huevos, pescado, pollo, pavo, y limite carnes rojas Coma 5 porciones diarias de legumbres.  Mezcle los colores Coma 2 porciones diarias de frutas con cascara cuando sea comestible Use platos pequeos Suelte su tenedor o cuchara despues de cada mordida hata que se mastique y se trague Come en la mesa con amigos o familiares por lo menos una vez al dia [place] la televisin y aparatos electrnicos durante la comida  Su objetivo debe ser perder una libra por semana  Estudios recientes indican que las personas quienes consumen todos de sus calorias durante 12 horas se bajan de pesocon Mas eficiencia.  Por ejemplo, si Usted come su primera comida a las 7:00 a.m., su comida final del dia se debe completar antes de las 7:00 p.m.  Osa Craver!!!!!!!!!!!!

## 2018-07-08 NOTE — Progress Notes (Signed)
Subjective:    Patient ID: Alyssa Garrison, female   DOB: 12/27/1975, 42 y.o.   MRN: [id number]   HPI   1.  DM:  A1C down to 7.4% from 7.7 % in November. See visit from November.  She continues with boot camp program daily for 45 minutes save for Sunday and Monday.   Later, states she has been missing the past 2 weeks due to illness.   She did not make many changes with eating back with the holidays. Did have eye check in November.  States vision fine/stable.  No diabetic changes. Did receive influenza vaccine in October.  2.  Elevated LDL:  HDL, total and LDL all going in wrong direction this check, but all at goal save for LDL.    Lipid Panel     Component Value Date/Time   CHOL 173 07/02/2018 0908   TRIG 82 07/02/2018 0908   HDL 49 07/02/2018 0908   CHOLHDL 2.9 05/15/2015 0902   VLDL 26 05/15/2015 0902   LDLCALC 108 (H) 07/02/2018 0908     3.  Cough:  Has been ill with URI symptoms, but still with a bit of cough.    4.  Hypertension:  Fair control  Current Meds  Medication Sig  . aspirin 81 MG tablet Take 1 tablet (81 mg total) by mouth daily.  . Blood Glucose Monitoring Suppl (AGAMATRIX PRESTO) w/Device KIT Check sugars twice daily before meals  . glipiZIDE (GLUCOTROL) 10 MG tablet TAKE 1 TABLET BY MOUTH TWICE DAILY WITH MEALS  . glucose blood (AGAMATRIX PRESTO TEST) test strip Check sugars twice daily before meals  . hydrochlorothiazide (HYDRODIURIL) 25 MG tablet TAKE 1 TABLET BY MOUTH  DAILY  . metFORMIN (GLUCOPHAGE) 1000 MG tablet TAKE 1 TABLET BY MOUTH TWICE DAILY WITH A MEAL   No Known Allergies   Review of Systems    Objective:   BP 130/80 (BP Location: Left Arm, Patient Position: Sitting, Cuff Size: Normal)   Pulse 70   Resp 12   Ht 4' 8" (1.422 m)   Wt 145 lb (65.8 kg)   LMP 07/01/2018   BMI 32.51 kg/m   Physical Exam  NAD HEENT:  PERRL, EOMI Neck:  Supple, No adenopathy Chest:  CTA CV:  RRR without murmur or rub.  Radial and DP  pulses normal and equal. LE: No edema   Assessment & Plan   1.  DM:  Mild improvement.  Discussed goal for A1C under 7.0% Encouraged working on diet and getting back to her boot camp or other regular daily physical activity. Repeat labs in 3 months followed by visit with me.  2.  Hypertension:  Controlled on HCTZ  3.  Elevated LDL:  Discussed goal to avoid medication is under 70 for LDL.  Repeat labs 3 months.  4.  Obesity:  As above.  

## 2018-08-31 ENCOUNTER — Other Ambulatory Visit: Payer: Self-pay

## 2018-08-31 ENCOUNTER — Telehealth (INDEPENDENT_AMBULATORY_CARE_PROVIDER_SITE_OTHER): Payer: Self-pay | Admitting: Internal Medicine

## 2018-08-31 DIAGNOSIS — L720 Epidermal cyst: Secondary | ICD-10-CM

## 2018-08-31 NOTE — Patient Instructions (Signed)
See A&P 

## 2018-08-31 NOTE — Progress Notes (Signed)
    Subjective:    Patient ID: Alyssa Garrison, female   DOB: 11-26-1975, 43 y.o.   MRN: [id number]   HPI   Video virtual visit via [facility] interpreting.  Pain and discoloration of right inner thigh for about 2 months.  States started with walking and clothing rubbing there.  Now with a bump surrounded by inflammation in the area.  Hurts when puts pressure on bump. Relates later she had a pustular lesion in same area 6 months ago.  She squeezed the lesion and white thin pustular, not cheesy thick discharge expressed.  Current Meds  Medication Sig  . aspirin 81 MG tablet Take 1 tablet (81 mg total) by mouth daily.  . Blood Glucose Monitoring Suppl (AGAMATRIX PRESTO) w/Device KIT Check sugars twice daily before meals  . glipiZIDE (GLUCOTROL) 10 MG tablet TAKE 1 TABLET BY MOUTH TWICE DAILY WITH MEALS  . glucose blood (AGAMATRIX PRESTO TEST) test strip Check sugars twice daily before meals  . hydrochlorothiazide (HYDRODIURIL) 25 MG tablet TAKE 1 TABLET BY MOUTH  DAILY  . metFORMIN (GLUCOPHAGE) 1000 MG tablet TAKE 1 TABLET BY MOUTH TWICE DAILY WITH A MEAL   No Known Allergies   Review of Systems    Objective:   There were no vitals taken for this visit.  Physical Exam   About 2 cm almost shape of a squat 8 hyperpigmented area with what appears to be a cystic subcutaneous lesion underlying.  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 Keep May 21 visit to evaluate for removal.  Discussed with patient to call if increased pain, fever, erythema.  Will treat with antibiotics if so. Otherwise, to keep appt end of May to address this as well. To keep covered when any friction will occur to area.

## 2018-10-01 ENCOUNTER — Other Ambulatory Visit: Payer: Self-pay

## 2018-10-01 DIAGNOSIS — E1129 Type 2 diabetes mellitus with other diabetic kidney complication: Secondary | ICD-10-CM

## 2018-10-01 DIAGNOSIS — E7800 Pure hypercholesterolemia, unspecified: Secondary | ICD-10-CM

## 2018-10-01 DIAGNOSIS — R809 Proteinuria, unspecified: Secondary | ICD-10-CM

## 2018-10-02 LAB — LIPID PANEL W/O CHOL/HDL RATIO
Cholesterol, Total: 189 mg/dL (ref 100–199)
HDL: 65 mg/dL (ref >39)
LDL Calculated: 107 mg/dL — ABNORMAL HIGH (ref 0–99)
Triglycerides: 85 mg/dL (ref 0–149)
VLDL Cholesterol Cal: 17 mg/dL (ref 5–40)

## 2018-10-02 LAB — MICROALBUMIN / CREATININE URINE RATIO
Creatinine, Urine: 156.8 mg/dL
Microalb/Creat Ratio: 7 mg/g creat (ref 0–29)
Microalbumin, Urine: 11 ug/mL

## 2018-10-02 LAB — HGB A1C W/O EAG: Hgb A1c MFr Bld: 7.2 % — ABNORMAL HIGH (ref 4.8–5.6)

## 2018-10-07 ENCOUNTER — Other Ambulatory Visit: Payer: Self-pay

## 2018-10-07 ENCOUNTER — Ambulatory Visit: Payer: Self-pay | Admitting: Internal Medicine

## 2018-10-07 ENCOUNTER — Encounter: Payer: Self-pay | Admitting: Internal Medicine

## 2018-10-07 VITALS — BP 122/80 | HR 74 | Resp 12 | Ht <= 58 in | Wt 153.0 lb

## 2018-10-07 DIAGNOSIS — Z6834 Body mass index (BMI) 34.0-34.9, adult: Secondary | ICD-10-CM

## 2018-10-07 DIAGNOSIS — R809 Proteinuria, unspecified: Secondary | ICD-10-CM

## 2018-10-07 DIAGNOSIS — L731 Pseudofolliculitis barbae: Secondary | ICD-10-CM

## 2018-10-07 DIAGNOSIS — E6609 Other obesity due to excess calories: Secondary | ICD-10-CM

## 2018-10-07 DIAGNOSIS — E1129 Type 2 diabetes mellitus with other diabetic kidney complication: Secondary | ICD-10-CM

## 2018-10-07 DIAGNOSIS — E7800 Pure hypercholesterolemia, unspecified: Secondary | ICD-10-CM

## 2018-10-07 NOTE — Progress Notes (Signed)
    Subjective:    Patient ID: Alyssa Garrison, female   DOB: [birth year], 43 y.o.   MRN: [id number]   HPI   1.  Elevated LDL:  Discussed LDL still high at 107, though her HDL is significantly higher at 65.   She surprisingly has not been physically active, despite these changes.  She has been afraid to go outside with the pandemic. Feels she can do more to improve her LDL before starting a statin.  2.  DM:  A1C continues to slowly trend to goal at 7.2%.  As in #1.    3.  Right inner thigh lesion:  States she has been applying warm packs (see last visit, which was a virtual video visit).  The bump that was there has flattened out, though no discharge.  Lesion hurts when her thighs rub together with walking, etc.  4.  Concern for food insecurity:  Her mother brought this up with a recent virtual visit.  Maisa states they are fine--not sure why her mother brought this up, but she will notify clinic if there is a problem.  They do have an income during the pandemic and are fine from her perspective. Current Meds  Medication Sig  . aspirin 81 MG tablet Take 1 tablet (81 mg total) by mouth daily.  . Blood Glucose Monitoring Suppl (AGAMATRIX PRESTO) w/Device KIT Check sugars twice daily before meals  . glipiZIDE (GLUCOTROL) 10 MG tablet TAKE 1 TABLET BY MOUTH TWICE DAILY WITH MEALS  . glucose blood (AGAMATRIX PRESTO TEST) test strip Check sugars twice daily before meals  . hydrochlorothiazide (HYDRODIURIL) 25 MG tablet TAKE 1 TABLET BY MOUTH  DAILY  . metFORMIN (GLUCOPHAGE) 1000 MG tablet TAKE 1 TABLET BY MOUTH TWICE DAILY WITH A MEAL   No Known Allergies   Review of Systems    Objective:   BP 122/80 (BP Location: Left Arm, Patient Position: Sitting, Cuff Size: Normal)   Pulse 74   Resp 12   Ht 4' 8" (1.422 m)   Wt 153 lb (69.4 kg)   LMP 09/27/2018   BMI 34.30 kg/m   Physical Exam  NAD Lungs:  CTA CV:  RRR without murmur or rub.  RAdial and DP pulses normal and equal  Right inner thigh:  2 cm hyperpigmented loose skinned oval--skin almost with small rugae with a papule at one end.  Tender with slight pressure over papule.  No surrounding erythema or swelling.   Assessment & Plan   1.  Elevated LDL:  Would like another 3 months to get out and work on physical activity and diet as well.  2.  DM/obesity:  Fairly good control.  As in #1  3.  Right thigh lesion:  Appears chronically irritated.  To call if worsens.  To continue to apply warm packs as appears to be some sort of plugged hair follicle.

## 2018-10-28 ENCOUNTER — Ambulatory Visit: Payer: Self-pay | Admitting: Internal Medicine

## 2018-10-28 ENCOUNTER — Other Ambulatory Visit: Payer: Self-pay

## 2018-10-28 ENCOUNTER — Encounter: Payer: Self-pay | Admitting: Internal Medicine

## 2018-10-28 VITALS — BP 122/78 | HR 76 | Resp 12 | Ht <= 58 in | Wt 155.0 lb

## 2018-10-28 DIAGNOSIS — L02415 Cutaneous abscess of right lower limb: Secondary | ICD-10-CM

## 2018-10-28 NOTE — Progress Notes (Signed)
    Subjective:    Patient ID: Alyssa Garrison, female   DOB: May 16, 1976, 43 y.o.   MRN: [id number]   HPI   Here as the ingrown hair and surrounding swelling has not improved.  She would like something definitive done.  Discussed risks and benefits of I & D.  Patient signed informed consent.  Current Meds  Medication Sig  . aspirin 81 MG tablet Take 1 tablet (81 mg total) by mouth daily.  . Blood Glucose Monitoring Suppl (AGAMATRIX PRESTO) w/Device KIT Check sugars twice daily before meals  . glipiZIDE (GLUCOTROL) 10 MG tablet TAKE 1 TABLET BY MOUTH TWICE DAILY WITH MEALS  . glucose blood (AGAMATRIX PRESTO TEST) test strip Check sugars twice daily before meals  . hydrochlorothiazide (HYDRODIURIL) 25 MG tablet TAKE 1 TABLET BY MOUTH  DAILY  . metFORMIN (GLUCOPHAGE) 1000 MG tablet TAKE 1 TABLET BY MOUTH TWICE DAILY WITH A MEAL   No Known Allergies   Review of Systems    Objective:   BP 122/78 (BP Location: Left Arm, Patient Position: Sitting, Cuff Size: Normal)   Pulse 76   Resp 12   Ht '4\' 8"'$  (1.422 m)   Wt 155 lb (70.3 kg)   LMP 09/28/2018   BMI 34.75 kg/m   Physical Exam   Right inner thigh, just below inguinal fold and about 1 inch from right lateral labia majora with 2.5 cm darkened, raised, fluctuant oval lesion with tiny pustular head at superior end.  Tender with palpation.  No surrounding erythema.  Procedure:  Area above cleaned in sterile fashion.  2.5 ml 2% lidocaine injected about the area in subcutaneous tissue.  1 cm incision down center of inferior pole of lesion performed with expression of no more than 1 ml of thin pustular fluid.   Wound explored with hemostat and short wick of sterile gauze placed in pocket of wound out through incision. Patient tolerated well without complication. Dry dressing placed over the area.     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  S/P I & D.   To remove  dressing with wick in morning and perform warm sitz baths daily, pat dry and redress.   Avoid long walks to prevent irritation from rubbing until healed. Encouraged weight loss to prevent in future. Call for fever, increased redness, swelling or pain.

## 2018-10-28 NOTE — Patient Instructions (Signed)
habla clinica si tiene problema

## 2018-10-30 ENCOUNTER — Other Ambulatory Visit: Payer: Self-pay | Admitting: Internal Medicine

## 2019-01-10 ENCOUNTER — Other Ambulatory Visit: Payer: Self-pay

## 2019-01-10 ENCOUNTER — Other Ambulatory Visit (INDEPENDENT_AMBULATORY_CARE_PROVIDER_SITE_OTHER): Payer: Self-pay

## 2019-01-10 DIAGNOSIS — E119 Type 2 diabetes mellitus without complications: Secondary | ICD-10-CM

## 2019-01-10 DIAGNOSIS — E7800 Pure hypercholesterolemia, unspecified: Secondary | ICD-10-CM

## 2019-01-11 LAB — LIPID PANEL W/O CHOL/HDL RATIO
Cholesterol, Total: 181 mg/dL (ref 100–199)
HDL: 58 mg/dL (ref >39)
LDL Calculated: 98 mg/dL (ref 0–99)
Triglycerides: 125 mg/dL (ref 0–149)
VLDL Cholesterol Cal: 25 mg/dL (ref 5–40)

## 2019-01-11 LAB — HGB A1C W/O EAG: Hgb A1c MFr Bld: 8.2 % — ABNORMAL HIGH (ref 4.8–5.6)

## 2019-01-13 ENCOUNTER — Encounter: Payer: Self-pay | Admitting: Internal Medicine

## 2019-01-13 ENCOUNTER — Ambulatory Visit: Payer: Self-pay | Admitting: Internal Medicine

## 2019-01-13 VITALS — BP 122/80 | HR 84 | Resp 12 | Ht <= 58 in | Wt 154.0 lb

## 2019-01-13 DIAGNOSIS — R809 Proteinuria, unspecified: Secondary | ICD-10-CM

## 2019-01-13 DIAGNOSIS — E1129 Type 2 diabetes mellitus with other diabetic kidney complication: Secondary | ICD-10-CM

## 2019-01-13 DIAGNOSIS — E7800 Pure hypercholesterolemia, unspecified: Secondary | ICD-10-CM

## 2019-01-13 DIAGNOSIS — F341 Dysthymic disorder: Secondary | ICD-10-CM

## 2019-01-13 DIAGNOSIS — I10 Essential (primary) hypertension: Secondary | ICD-10-CM

## 2019-01-13 NOTE — Patient Instructions (Signed)
Alyssa Garrison will be calling you to talk

## 2019-01-13 NOTE — Progress Notes (Signed)
    Subjective:    Patient ID: Brennan Bailey, female   DOB: 18-Feb-1976, 43 y.o.   MRN: [id number]   HPI   Interpreted by Irving Shows  1.  DM:  A1C jumped to 8.2% from 7.2% in past 3 months.  States she is not eating in a healthy way.  States she in anxious due to being stuck in home with pandemic.  Was stress eating sweets and carbs and not being physically active. Now her children, ages 45 and 25 are doing studies again, she is finding time to get out and get physically active.  2.  Crying:  Finds herself crying easily in past 2 weeks.  Her husband is good to her and she states they have a good relationship.  She is worried about family in [place] with the pandemic and everything going on in the world.  Cannot say anything is specifically causing her a problem.   No suicidal ideation. No problems getting out of bed, cleaning up and getting ready for her day. Sleeping fine and refreshed in the morning.  Willing to speak with [employer], LCSW-A.  3.  Elevated LDL:  Down a bit, but not at goal.  Lipid Panel     Component Value Date/Time   CHOL 181 01/10/2019 0859   TRIG 125 01/10/2019 0859   HDL 58 01/10/2019 0859   CHOLHDL 2.9 05/15/2015 0902   VLDL 26 05/15/2015 0902   LDLCALC 98 01/10/2019 0859     Current Meds  Medication Sig  . aspirin 81 MG tablet Take 1 tablet (81 mg total) by mouth daily.  . Blood Glucose Monitoring Suppl (AGAMATRIX PRESTO) w/Device KIT Check sugars twice daily before meals  . glipiZIDE (GLUCOTROL) 10 MG tablet TAKE 1 TABLET BY MOUTH TWICE DAILY WITH MEALS  . glucose blood (AGAMATRIX PRESTO TEST) test strip Check sugars twice daily before meals  . hydrochlorothiazide (HYDRODIURIL) 25 MG tablet Take 1 tablet by mouth once daily  . metFORMIN (GLUCOPHAGE) 1000 MG tablet TAKE 1 TABLET BY MOUTH TWICE DAILY WITH A MEAL   No Known Allergies   Review of Systems    Objective:   BP 122/80 (BP Location: Left Arm, Patient Position: Sitting, Cuff  Size: Normal)   Pulse 84   Resp 12   Ht '4\' 8"'$  (1.422 m)   Wt 154 lb (69.9 kg)   LMP 12/26/2018   BMI 34.53 kg/m   Physical Exam   NAD Tearful at times Lungs:  CTA CV:  RRR without murmur or rub.  RAdial pulses normal and equal LE: No edema.  Diabetic foot exam was performed with the following findings:   No deformities, ulcerations, or other skin breakdown Normal sensation of 10g monofilament Intact posterior tibialis and dorsalis pedis pulses       Assessment & Plan   1.  DM:  Feels she is back on track.  Discussed never letting her good diet or regular physical activity fall away for more than 24-48 hours in future.  2.  Elevated LDL:  Improved, but still not at goal.  As above.  3   Hypertension:  Controlled  4.  Dysthymia:  Referral to [employer], LCSW-A

## 2019-01-18 ENCOUNTER — Ambulatory Visit: Payer: Self-pay | Admitting: Licensed Clinical Social Worker

## 2019-01-18 ENCOUNTER — Other Ambulatory Visit: Payer: Self-pay

## 2019-01-18 DIAGNOSIS — F411 Generalized anxiety disorder: Secondary | ICD-10-CM

## 2019-01-18 NOTE — Clinical Social Work Psychosocial (Addendum)
Client name: Alyssa Garrison Date of birth: Jul 11, 1975  Address:  [address] [place], [place] [address]  Telephone:    [phone] Email:    Emergency contact name and telephone:  Everette Rank  [phone]  Transgender:    Sexual orientation:  heterosexual Preferred pronouns:  she,her,hers Preferred name:  Alyssa Garrison  Race:  Other Ethnicity: Hispanic  Country of origin:  [place] Number of years in the [place]: 10+   Current legal status:   Language Preferred:  Spanish  Marital Status:  Married How long? 10+ years  Live in same home?  Yes If not married, long term relationship?   How long?     EDUCATIONAL HISTORY  High School Name:   Graduated? If, no last grade attended?      College/University Name:   If still in college/university, current level?    Year graduated (or expected graduation date)   Major/Program    Currently receiving academic assistance and accessibility services?    If not currently in school, EC/IEP services in the past? What type?    Changed schools frequently?    Truancy/attendance issues?     History of suspensions, expulsions (if any) (reasons, dates):      Interests in school, sports, extracurricular activities:        Have you experienced bullying and/or discrimination in school?   (include previous/current examples and age when it occurred)        Have you ever bullied and/or discriminated against anyone in school? (include previous/current examples and age when it occurred)    LEGAL/GOVERNMENTAL HISTORY  Past arrests, charges, incarcerations, etc: If yes, include charges and if convicted, time incarcerated:      Current DSS/DHHS involvement, including foster care:  If yes, include reason, and current status:      Current DSS Case Worker name, phone number and email:    Past DSS/DHHS involvement:   If yes, include reason, and resolution status:      EDUCATIONAL/EMPLOYMENT HISTORY  Current Employer (Name/Address/Telephone) Housewife     Current position:    How long have you worked for this employer?    Have you been promoted?  Y  If yes, from which position and when?    What is your standing at your current employer (good, probation, etc.) If you are on probation, please describe:    Does your employer have any American Disabilities Act accommodations for you? If yes, please describe accommodations:   Do you like your immediate supervisor/co-workers? If no, what are the reasons?   Do you have any acquaintances/friends at work? If yes, do you socialize with your co-workers outside of work?    Current Military status (if applicable include dishonorable discharges and the reason):       PRESENTING CONCERNS AND SYMPTOMS (problems/symptoms, frequency of symptoms, triggers, family dynamics, etc.)  Patient states she is feeling very anxious on a daily basis.  Patient states her anxiety stems from concern over children's school schedule and activities.  Patient states her main concern comes from her relationship with 90 y/o daughter.  Patient states since the school was moved to virtual school in March her daughter has been talking back, not telling what she is doing in class and says that her mother does not pay enough attention to her.  Patient states she is scared about daughter falling behind and that is why she is so hard on her.  Patient states, she tends to pay more attention to her son due to his autism diagnosis  and she does not understand why her daughter does not understand that.  Patient states she is always worrying about her daughter school progress and is tired of arguing with her and would like to find new solutions to this problem.   HISTORY OF PRESENTING PROBLEMS (precipitating events, trauma history, when symptoms/behaviors began, life changes, etc.)  Patient states she has always been mildly anxious, especially after having children.    PHYSICIAN and/or [place] (in the last 2 years) (most current  provider first)   Name of Facility/Provider Address and contact information Type of service:   [facility], [place], [place] PCP             PAST PSYCHIATRIC AND SUBSTANCE ABUSE TREATMENT HISTORY  Dates: from Dates: To Facility/Provider Tx Type   Outcome/Follow-up and Compliance                         SYMPTOMS (mark with X/how long, if present)  DEPRESSIVE SYMPTOMS  Sadness/crying/depressed mood:         Suicidal thoughts:   Sleep disturbance:     Irritability:    Feelings of worthlessness/guilt:     Anhedonia (inability to feel pleasure):   Psychomotor agitation/retardation (excessive physical activity/difficulty with physical coordination):      Reduced appetite/weight loss:   Fatigue:     Increased appetite/weight gain:   Concentration/ memory problems:      ANXIETY SYMPTOMS  Separation anxiety (from family/children/friends/animals):   Obsessions/compulsions:      Phobia:    Agoraphobia symptoms (fear of leaving the safety of the home):     Social anxiety:   Excessive anxiety/worry: x    Feeling of dread/doom:   Cannot control worry: x    Panic attacks:   Restlessness/difficulty relaxing:     Irritability: x  Muscle tension/ sweating/nausea /trembling:     Excessive use of the bathroom due to stomach discomfort:    Easily startled:      ATTENTION SYMPTOMS    Avoids tasks that require mental effort:   Often loses/misplaces things:     Makes careless mistakes:   Easily distracted by extraneous (outside) stimuli:     Difficulty sustaining attention:   Forgetful in daily activities:     Does not seem to listen when spoken to:    Messy/disorganized:     Does not follow instructions/fails to finish:   Unable to sit quietly; fidgets, squirms:     Talks excessively:   Impatient when waiting:      Interrupts or intrudes on others:   "On the go"/ "Driven by a motor":      MANIC SYMPTOMS  Elevated, expansive or irritable mood:   Decreased need for  sleep (without a desire to sleep/not feeling tired):     Abnormally increased goal-directed activity or energy:    Flight of ideas/racing thoughts:     Inflated self-esteem/grandiosity:   High risk activities, (sexual and non-sexual):      PSYCHOTIC SYMPTOMS  Delusions (not being able to tell what is real or what is imagined):                             Hallucinations (Visual/Auditory) (seeing/hearing things that are not seen or heard by others):     Disorganized thinking/speech (difficult to understand/comprehend when speaking):   Disorganized or abnormal motor behavior:     Negative symptoms (decrease or  loss of ability to respond emotionally):    Catatonia (immobility and/or stupor):            TRAUMA CHECKLIST (TRIGGER WARNING)  Have you ever experienced the following? If yes, describe  Have you ever been in a natural disaster, terrorist attack, or war?    Age:                          Duration:   Have you ever been the victim of a violent crime (e.g. kidnapping, robbery, assault) or a crime with a deadly weapon (e.g. gun, knife)?     Age:                       Year:   Have you ever been the victim of a hate crime?  Age:                               Year:   Have you ever been in a fire?  Age:                                      Year:   Have you ever been in a serious car accident? Age:               Were you physically injured?  YES / NO (if yes) Were you hospitalized?   YES / NO          Duration:    Have you ever been incarcerated?    YES / NO     Duration: Were you convicted of a violent crime? YES / NO What crime were you convicted of?   Have you ever been seriously hurt or injured? (for example: non-vehicle accident, fall, fight) YES / NO    Were you hospitalized? YES / NO                        Duration:    Have you ever experienced a life-threatening illness or injury?  YES / NO Are you still under medical treatment for that injury?      Have you ever been  hospitalized for an extended period of time, for any reason?    As a child, adolescent or adult, has there ever been a time when you did not have enough food to eat?     As a child, adolescent or adult, have you ever been homeless or did move around a lot due to financial difficulties?    As a child, adolescent or adult, have you ever seen or heard someone (family or not) being physically and/or verbally abused or get threatened with bodily harm?    As a child, adolescent or adult, have you ever seen, someone who was dead or dying, or watched or heard them being killed?    As a child, adolescent or adult, have you ever seen or heard someone being killed?    As a child, adolescent or adult, have you ever been physically or  verbally aggressive towards other people?    As a child, adolescent or adult, have you ever been physically assaulted, hurt or verbally threatened with physical harm by someone you know?     As a child, adolescent or adult, has anyone ever stalked you (  physically and/or electronically) or tried to kidnap you?    As a child and/or adolescent, has anyone ever made you do (or tried to make you do) sexual things that you didn't want to do, like touch you, make you touch them, or try to have any kind of sex with you?     Has anyone (stranger or someone known to you) ever forced you to have sexual relations/intercourse?    As a child, adolescent or adult have you ever been the victim of sex trafficking and/or forced into sex work?      PTSD REACTIONS/SYMPTOMS (mark with X if present)  Recurrent and intrusive distressing memories of event:  Flashbacks/Feels/acts as if the event were recurring:   Distressing dreams related to the event:  Intense psychological distress to reminders of event:   Avoidance of memories, thoughts, feelings about event:  Physiological reactions to reminders of event:   Avoidance of external reminders of event:  Inability to remember aspects of the  event:   Negative beliefs about oneself, others, the world:  Persistent negative emotional state/self-blame:   Detachment/inability to feel positive emotions:  Alterations in arousal activities (jumpiness, starling easily, being on edge)   SUBSTANCE ABUSE  Substance Age of 1st Use Amount/frequency Last Use              Motivation for use:     Do you spend a lot of time or effort in obtaining and using a substance?   Does your substance use affect your activities of daily living (eating, working, socializing, bathing)?   Do you use more often or in bigger amount than planned?    Tolerance issues:    Interest in reducing use and attaining abstinence:    Longest period of abstinence:    Withdrawal symptoms:    Problems usage caused (physical, financial and/or social):    Non-chemical addiction issues: (gambling, pornography, etc)    Problems non-chemical addiction has caused (both physical, financial and/or social):     DEVELOPMENT (please list any diagnosis, issues or concerns)  Developmental milestones (crawling, walking, talking, etc):   Developmental condition (delay, autism, etc):    Cognitive decline (forgetfulness, Alzheimer's, dementia)   Learning disabilities:     PSYCHOSOCIAL STRENGTHS AND STRESSORS  Religious/cultural  preferences:   Identified support persons: (include at least two people)   Name   Telephone/Email  Strengths/abilities/talents:   Hard working, loves her children, likes keeping up her house  Hobbies/leisure:     Relationship concerns/needs:      Financial concerns/needs:      Pensions consultant (other sources of income, 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):     Housing concerns/needs:       SIGNATURE  Print Patient Name:  Alyssa Garrison Date:       Client name:  Alyssa Garrison Date of birth: March 10, 1976    RISK ASSESSMENT (mark with X if present)  Current danger to self Thoughts of suicide/death:  Self-harming behaviors:    Suicide attempt:   Has plan:    Comments/clarify:       Past danger to self Thoughts of suicide/death:   Self-harming behaviors:    Suicide attempt:  Family history of suicide:    Comments/clarify:       Current danger to others Thoughts to harm others:  Plans to harm others:    Threats to harm others:  Attempt to harm others:    Comments/clarify:      Past danger to others Thoughts to harm others:  Plans  to harm others:    Threats to harm others:  Attempt to harm others:    Comments/clarify:     RISK TO SELF Low to no risk: x Moderate risk:  Severe risk:   RISK TO OTHERS Low to no risk: x Moderate risk:  Severe risk:      MENTAL STATUS (mark with X if observed)  APPEARANCE/DRESS  Neat: x Good hygiene: x Age appropriate: x   Sloppy:  Fair hygiene:  Eccentric:    Relaxed: x Poor hygiene:       BEHAVIOR Attentive: x Passive:   Adequate eye contact:  X   Guarded:  Defensive:   Minimal eye contact:    Cooperative:  Hostile/irritable:   No eye contact:     MOTOR Hyper:  Hypo:  Rapid:    Agitated:  Tics:  Tremors:    Lethargic:  Calm:       LANGUAGE Unremarkable:  Pressured: mild  Expressive intact:  x   Mute:  Slurred:  Receptive intact: x    AFFECT/MOOD  Calm:  Anxious: x Inappropriate:    Depressed:  Flat:  Elevated:     Labile:  Agitated:  Hypervigilant:     THOUGHT FORM Unremarkable:  x Illogical:  Indecisive:    Circumstantial:  Flight of ideas:  Loose associations:    Obsessive thinking:  Distractible:  Tangential:      THOUGHT CONTENT Unremarkable: x Suicidal:  Obsessions:    Homicidal:  Delusions:  Hallucinations:    Suspicious:  Grandiose:  Phobias:      ORIENTATION Fully oriented: x Not oriented to person:  Not oriented to place:    Not oriented to time:  Not oriented to situation:        ATTENTION/ CONCENTRATION Adequate:  x Mildly distractible:  Moderately distractible:    Severely distractible:  Problems concentrating:        INTELLECT Suspected above average: x  Suspected average:   Suspected below average:    Known disability:  Uncertain:        MEMORY Within normal limits: x Impaired:  Selective:      PERCEPTIONS Unremarkable:  x Auditory hallucinations:  Visual hallucinations:    Dissociation:  Traumatic flashbacks:  Ideas of reference:      JUDGEMENT Poor:  Fair:  x Good:      INSIGHT Poor:  Fair:  x Good:      IMPULSE CONTROL Adequate: x Needs to be addressed:  Poor:         CLINICAL ASSESSMENT (risk of harm, recovery environment, functional status, diagnostic criteria met)  Risk of harm: Low   Recovery environment: Fair   Functional status: Fair Diagnostic criteria met: Yes    DIAGNOSIS   DSM-5 Code ICD-10 Code Diagnosis    F41.1  Generalized Anxiety Disorder              Treatment recommendations and service needs: Narrative therapy with CBT, DBT elements  Relational Therapy       SIGNATURE  Printed name of clinician: Dannielle Karvonen, MSW, MA  Date:  01/18/2019  Signature of supervisor: N/A Date:

## 2019-01-25 DIAGNOSIS — F411 Generalized anxiety disorder: Secondary | ICD-10-CM | POA: Insufficient documentation

## 2019-02-01 ENCOUNTER — Other Ambulatory Visit: Payer: Self-pay | Admitting: Licensed Clinical Social Worker

## 2019-03-29 ENCOUNTER — Telehealth: Payer: Self-pay | Admitting: Internal Medicine

## 2019-03-29 DIAGNOSIS — E119 Type 2 diabetes mellitus without complications: Secondary | ICD-10-CM

## 2019-03-29 MED ORDER — GLIPIZIDE 10 MG PO TABS: ORAL_TABLET | ORAL | 11 refills | Status: DC

## 2019-03-29 NOTE — Telephone Encounter (Signed)
Please notify patient. Rx sent to [facility]

## 2019-03-29 NOTE — Telephone Encounter (Signed)
Patient called requesting Rx on glipiZIDE (GLUCOTROL) 10 MG tablet to be called in at [facility].  Please advise.

## 2019-04-18 ENCOUNTER — Other Ambulatory Visit: Payer: Self-pay

## 2019-04-19 ENCOUNTER — Other Ambulatory Visit: Payer: Self-pay

## 2019-04-19 ENCOUNTER — Other Ambulatory Visit (INDEPENDENT_AMBULATORY_CARE_PROVIDER_SITE_OTHER): Payer: Self-pay

## 2019-04-19 DIAGNOSIS — E7800 Pure hypercholesterolemia, unspecified: Secondary | ICD-10-CM

## 2019-04-19 DIAGNOSIS — E119 Type 2 diabetes mellitus without complications: Secondary | ICD-10-CM

## 2019-04-20 LAB — LIPID PANEL W/O CHOL/HDL RATIO
Cholesterol, Total: 184 mg/dL (ref 100–199)
HDL: 66 mg/dL (ref >39)
LDL Chol Calc (NIH): 105 mg/dL — ABNORMAL HIGH (ref 0–99)
Triglycerides: 72 mg/dL (ref 0–149)
VLDL Cholesterol Cal: 13 mg/dL (ref 5–40)

## 2019-04-20 LAB — HGB A1C W/O EAG: Hgb A1c MFr Bld: 8.3 % — ABNORMAL HIGH (ref 4.8–5.6)

## 2019-04-21 ENCOUNTER — Other Ambulatory Visit: Payer: Self-pay

## 2019-04-21 ENCOUNTER — Encounter: Payer: Self-pay | Admitting: Internal Medicine

## 2019-04-21 ENCOUNTER — Ambulatory Visit: Payer: Self-pay | Admitting: Internal Medicine

## 2019-04-21 VITALS — BP 128/80 | HR 66 | Resp 12 | Ht <= 58 in | Wt 154.0 lb

## 2019-04-21 DIAGNOSIS — E6609 Other obesity due to excess calories: Secondary | ICD-10-CM

## 2019-04-21 DIAGNOSIS — Z0000 Encounter for general adult medical examination without abnormal findings: Secondary | ICD-10-CM

## 2019-04-21 DIAGNOSIS — Z1231 Encounter for screening mammogram for malignant neoplasm of breast: Secondary | ICD-10-CM

## 2019-04-21 DIAGNOSIS — B079 Viral wart, unspecified: Secondary | ICD-10-CM

## 2019-04-21 DIAGNOSIS — I10 Essential (primary) hypertension: Secondary | ICD-10-CM

## 2019-04-21 DIAGNOSIS — E7800 Pure hypercholesterolemia, unspecified: Secondary | ICD-10-CM

## 2019-04-21 DIAGNOSIS — E119 Type 2 diabetes mellitus without complications: Secondary | ICD-10-CM

## 2019-04-21 DIAGNOSIS — Z6834 Body mass index (BMI) 34.0-34.9, adult: Secondary | ICD-10-CM

## 2019-04-21 DIAGNOSIS — R011 Cardiac murmur, unspecified: Secondary | ICD-10-CM

## 2019-04-21 MED ORDER — ATORVASTATIN CALCIUM 20 MG PO TABS: ORAL_TABLET | ORAL | 11 refills | Status: DC

## 2019-04-21 NOTE — Patient Instructions (Signed)
Find Mediplast plasters to cut and fit to warts on finger and change daily or when get wet. Can use up to 12 weeks.  Tome un vaso de agua antes de cada comida Tome un minimo de 6 a 8 vasos de agua diarios Coma tres veces al dia Coma una proteina y Ardelia Mems grasa saludable con comida.  (huevos, pescado, pollo, pavo, y limite carnes rojas Coma 5 porciones diarias de legumbres.  Mezcle los colores Coma 2 porciones diarias de frutas con cascara cuando sea comestible Use platos pequeos Suelte su tenedor o cuchara despues de cada mordida hata que se mastique y se trague Come en la mesa con amigos o familiares por lo menos una vez al dia Apague la televisin y aparatos electrnicos durante la comida  Su objetivo debe ser perder una libra por semana  Estudios recientes indican que las personas quienes consumen todos de sus calorias durante 12 horas se bajan de pesocon Mas eficiencia.  Por ejemplo, si Usted come su primera comida a las 7:00 a.m., su comida final del dia se debe completar antes de las 7:00 p.m.

## 2019-04-21 NOTE — Progress Notes (Signed)
Subjective:    Patient ID: Alyssa Garrison, female   DOB: May 06, 1976, 43 y.o.   MRN: [id number]   HPI   CPE without pap  1.  Pap:  Last normal on 12/24/2017.  2.  Mammogram:  Last mammogram 05/21/2017 and normal.  Did not go this past year.  No family history of breast cancer.  3.  Osteoprevention:  Does drink or eat dairy daily, but not a regular amount.   Is involved in Zumba classes 4-5 times per week.  They are spreading out and wearing masks.   4.  Guaiac Cards:  Performed last September and negative.    5.  Colonoscopy:  Never.  No family history of colon cancer.  6.  Immunizations:   Immunization History  Administered Date(s) Administered  . Influenza Inj Mdck Quad Pf 05/15/2017, 04/01/2018  . Influenza Split 01/24/2013  . Influenza Whole 03/14/2008  . Influenza,inj,Quad PF,6+ Mos 06/16/2014, 04/03/2015  . Influenza-Unspecified 01/18/2019  . Pneumococcal Polysaccharide-23 08/28/2016  . Tdap 05/03/2015     7.  Glucose/Cholesterol:  A1C now up to 8.3% on 04/19/2019. Has been as low as 7.4% in past 2 years.   LDL from same date not at goal as well. Drinking a protein Herbalife shake in the morning.   Zumba for the last 3 months on a regular basis.  Lipid Panel     Component Value Date/Time   CHOL 184 04/19/2019 0905   TRIG 72 04/19/2019 0905   HDL 66 04/19/2019 0905   CHOLHDL 2.9 05/15/2015 0902   VLDL 26 05/15/2015 0902   LDLCALC 105 (H) 04/19/2019 0905   LABVLDL 13 04/19/2019 0905      Current Meds  Medication Sig  . aspirin 81 MG tablet Take 1 tablet (81 mg total) by mouth daily.  . Blood Glucose Monitoring Suppl (AGAMATRIX PRESTO) w/Device KIT Check sugars twice daily before meals  . glipiZIDE (GLUCOTROL) 10 MG tablet TAKE 1 TABLET BY MOUTH TWICE DAILY WITH MEALS  . glucose blood (AGAMATRIX PRESTO TEST) test strip Check sugars twice daily before meals  . hydrochlorothiazide (HYDRODIURIL) 25 MG tablet Take 1 tablet by mouth once daily  .  metFORMIN (GLUCOPHAGE) 1000 MG tablet TAKE 1 TABLET BY MOUTH TWICE DAILY WITH A MEAL   No Known Allergies  Past Medical History:  Diagnosis Date  . Depression   . Diabetes mellitus without complication ([place])   . Essential hypertension, benign 08/22/2013  . Onychomycosis 03/08/2017  . Onychomycosis of toenail 08/28/2016    No past surgical history on file. Family History  Problem Relation Age of Onset  . Diabetes Mother   . Hyperlipidemia Mother   . Hypertension Mother   . Diabetes Paternal Grandmother   . Diabetes Father   . Autism spectrum disorder Son   . ADD / ADHD Son   . Breast cancer Neg Hx     Family Status  Relation Name Status  . Mother Mason Jim, age 106y  . PGM  Deceased  . Father Waynette Buttery, age 77y       Patient here  . Brother  Alive  . Daughter Hermine Messick, age 50y  . Son Rae Roam, age 47y  . Brother  Alive  . Neg Hx  (Not Specified)    Social History   Socioeconomic History  . Marital status: Married    Spouse name: Glennie Isle  . Number of children: 2  . Years of education: 90  . Highest education level: Not on  file  Occupational History  . Occupation: Housewife    Comment: One day out of week works in a [employer]  . Financial resource strain: Not on file  . Food insecurity    Worry: Never true    Inability: Never true  . Transportation needs    Medical: No    Non-medical: No  Tobacco Use  . Smoking status: Never Smoker  . Smokeless tobacco: Never Used  Substance and Sexual Activity  . Alcohol use: Yes    Alcohol/week: 1.0 standard drinks    Types: 1 Shots of liquor per week  . Drug use: No  . Sexual activity: Yes    Birth control/protection: Condom    Comment: every time  Lifestyle  . Physical activity    Days per week: 7 days    Minutes per session: 50 min  . Stress: Rather much  Relationships  . Social Herbalist on phone: Not on file    Gets together: Not on file    Attends  religious service: Not on file    Active member of club or organization: Not on file    Attends meetings of clubs or organizations: Not on file    Relationship status: Married  . Intimate partner violence    Fear of current or ex partner: No    Emotionally abused: No    Physically abused: No    Forced sexual activity: No  Other Topics Concern  . Not on file  Social History Narrative   Lives at home with husband and daughter, son       Review of Systems  Skin:       Lesions on left ring finger--medial periungual region and lower on dorsal finger between PIP and DIP      Objective:   BP 128/80 (BP Location: Left Arm, Patient Position: Sitting, Cuff Size: Normal)   Pulse 66   Resp 12   Ht '4\' 8"'$  (1.422 m)   Wt 154 lb (69.9 kg)   LMP 03/22/2019   BMI 34.53 kg/m   Physical Exam  Constitutional: She is oriented to person, place, and time. She appears well-developed and well-nourished.  HENT:  Head: Normocephalic and atraumatic.  Right Ear: Hearing, tympanic membrane, external ear and ear canal normal.  Left Ear: Tympanic membrane, external ear and ear canal normal.  Eyes: Pupils are equal, round, and reactive to light. Conjunctivae and EOM are normal.  Neck: Normal range of motion and full passive range of motion without pain. Neck supple. No thyromegaly present.  Cardiovascular: Normal rate, regular rhythm, S1 normal and S2 normal. Exam reveals no S3, no S4 and no friction rub.  Murmur (SEM in LUSB radiating to right 2nd interpace and somewhat into carotids.  Hear this best when sitting.  ) heard. No carotid bruits.  Carotid, radial, femoral, DP and PT pulses normal and equal.   Pulmonary/Chest: Effort normal and breath sounds normal. Right breast exhibits no inverted nipple, no mass, no nipple discharge, no skin change and no tenderness. Left breast exhibits no inverted nipple, no mass, no nipple discharge, no skin change and no tenderness.  Abdominal: Soft. Bowel sounds are  normal. She exhibits no mass. There is no hepatosplenomegaly. There is no abdominal tenderness. No hernia.  Genitourinary:    Genitourinary Comments: Normal external female genitalia. Uterus is anteverted and without mass or tenderness.  No adnexal mass or tenderness.   Musculoskeletal: Normal range of motion.  Lymphadenopathy:  Head (right side): No submental and no submandibular adenopathy present.       Head (left side): No submental and no submandibular adenopathy present.    She has no cervical adenopathy.    She has no axillary adenopathy.       Right: No inguinal and no supraclavicular adenopathy present.       Left: No inguinal and no supraclavicular adenopathy present.  Neurological: She is alert and oriented to person, place, and time. She has normal reflexes. No cranial nerve deficit. Coordination normal.  Diabetic foot exam was performed with the following findings:   No deformities, ulcerations, or other skin breakdown Normal sensation of 10g monofilament Intact posterior tibialis and dorsalis pedis pulses Some toenails with whitish crumbling at distal edge.  No thickening.     Skin: Skin is warm. No rash noted.  Rough elevated, but flat lesion at medial periungual area of left ring finger.   3 mm raised flat lesion on dorsal finger between DIP and PIP.  Psychiatric: She has a normal mood and affect. Her speech is normal and behavior is normal. Judgment and thought content normal.     Assessment & Plan  1.  CPE without pap Mammogram Guaiac cards x 3 to return in 2 weeks.   2.  DM:  Urged to keep working on diet and physical activity to improve control.  Repeat  A1C in 3 months and if not at goal, will need to consider adding meds. [facility] for ophthalmology.  She will call--believes it is in January  3  Hypertension:  Controlled  4.  Elevated LDL: start Atorvastatin 20 mg daily with repeat FLP/hepatic profile in 6 weeks.  5.  Common warts of left ring  finger:  Information given on using Mediplast Salicyclic acid plasters  6.  Systolic Heart Murmur:  Follow for now.  Consider Echo in future if new findings/progress/symptoms develop.

## 2019-04-28 ENCOUNTER — Other Ambulatory Visit: Payer: Self-pay

## 2019-04-28 MED ORDER — ATORVASTATIN CALCIUM 20 MG PO TABS: ORAL_TABLET | ORAL | 11 refills | Status: DC

## 2019-05-16 ENCOUNTER — Telehealth: Payer: Self-pay | Admitting: Licensed Clinical Social Worker

## 2019-05-16 NOTE — Telephone Encounter (Signed)
Contacted patient to inform patient of Benson last day of employment, to terminate services and give options for continuation of care. Options for continuation of care/termination are:  . Place patient on Wait List to make an appointment with one the [facility] interns, Rudene Anda or Danae Chen in January 2021. Marland Kitchen Place patient on wait list for new LCSW. Marland Kitchen Refer patient to outside therapist or agency. . Termination of mental health service with the clinic, at this time  Patient requested termination of care.

## 2019-06-03 ENCOUNTER — Other Ambulatory Visit: Payer: Self-pay

## 2019-06-09 ENCOUNTER — Other Ambulatory Visit: Payer: Self-pay

## 2019-06-10 ENCOUNTER — Other Ambulatory Visit (INDEPENDENT_AMBULATORY_CARE_PROVIDER_SITE_OTHER): Payer: Self-pay

## 2019-06-10 ENCOUNTER — Other Ambulatory Visit: Payer: Self-pay

## 2019-06-10 DIAGNOSIS — E7800 Pure hypercholesterolemia, unspecified: Secondary | ICD-10-CM

## 2019-06-10 DIAGNOSIS — Z79899 Other long term (current) drug therapy: Secondary | ICD-10-CM

## 2019-06-11 LAB — HEPATIC FUNCTION PANEL
ALT: 16 IU/L (ref 0–32)
AST: 16 IU/L (ref 0–40)
Albumin: 4.5 g/dL (ref 3.8–4.8)
Alkaline Phosphatase: 69 IU/L (ref 39–117)
Bilirubin Total: 0.5 mg/dL (ref 0.0–1.2)
Bilirubin, Direct: 0.14 mg/dL (ref 0.00–0.40)
Total Protein: 7.8 g/dL (ref 6.0–8.5)

## 2019-06-11 LAB — LIPID PANEL W/O CHOL/HDL RATIO
Cholesterol, Total: 132 mg/dL (ref 100–199)
HDL: 62 mg/dL (ref >39)
LDL Chol Calc (NIH): 56 mg/dL (ref 0–99)
Triglycerides: 68 mg/dL (ref 0–149)
VLDL Cholesterol Cal: 14 mg/dL (ref 5–40)

## 2019-06-24 ENCOUNTER — Other Ambulatory Visit: Payer: Self-pay

## 2019-06-24 MED ORDER — METFORMIN HCL 1000 MG PO TABS: ORAL_TABLET | ORAL | 11 refills | Status: DC

## 2019-07-26 ENCOUNTER — Other Ambulatory Visit: Payer: Self-pay

## 2019-07-26 DIAGNOSIS — R809 Proteinuria, unspecified: Secondary | ICD-10-CM

## 2019-07-26 DIAGNOSIS — E1129 Type 2 diabetes mellitus with other diabetic kidney complication: Secondary | ICD-10-CM

## 2019-07-27 LAB — HGB A1C W/O EAG: Hgb A1c MFr Bld: 8.2 % — ABNORMAL HIGH (ref 4.8–5.6)

## 2019-07-28 ENCOUNTER — Encounter: Payer: Self-pay | Admitting: Internal Medicine

## 2019-07-28 ENCOUNTER — Ambulatory Visit: Payer: Self-pay | Admitting: Internal Medicine

## 2019-07-28 ENCOUNTER — Other Ambulatory Visit: Payer: Self-pay

## 2019-07-28 VITALS — BP 132/80 | HR 68 | Resp 12 | Ht <= 58 in | Wt 152.0 lb

## 2019-07-28 DIAGNOSIS — E119 Type 2 diabetes mellitus without complications: Secondary | ICD-10-CM

## 2019-07-28 DIAGNOSIS — Z6834 Body mass index (BMI) 34.0-34.9, adult: Secondary | ICD-10-CM

## 2019-07-28 DIAGNOSIS — I10 Essential (primary) hypertension: Secondary | ICD-10-CM

## 2019-07-28 DIAGNOSIS — E6609 Other obesity due to excess calories: Secondary | ICD-10-CM

## 2019-07-28 NOTE — Progress Notes (Signed)
     Subjective:    Patient ID: Alyssa Garrison, female   DOB: July 11, 1975, 44 y.o.   MRN: [id number]   HPI   1.  DM:  A1C still not at goal/about the same at 8.2%.  States she is eating more vegetables and water.  8:30 a.m.:  Eggs with spinach. Papaya, maybe 8 oz. Tea or coffee or water.  2 p.m.:  Fish, roast chicken, or boiled chicken in tomato and pepper sauce.  No tortillas.  Beans  5 p.m.:  Chicken again with cheese.  Sometimes a side of veggie.  Walking in her house--treadmill when does not go out for a walk.  30 minutes.  Then would do sit ups, jump rope, etc for another 20-30 minutes.  She feels she does something like this every day.    Ultimately, admits she did not start a lot of these lifestyle changes until just 1 month ago.      Current Meds  Medication Sig  . aspirin 81 MG tablet Take 1 tablet (81 mg total) by mouth daily.  Marland Kitchen atorvastatin (LIPITOR) 20 MG tablet 1 tab by mouth daily with evening meal  . Blood Glucose Monitoring Suppl (AGAMATRIX PRESTO) w/Device KIT Check sugars twice daily before meals  . glipiZIDE (GLUCOTROL) 10 MG tablet TAKE 1 TABLET BY MOUTH TWICE DAILY WITH MEALS  . glucose blood (AGAMATRIX PRESTO TEST) test strip Check sugars twice daily before meals  . hydrochlorothiazide (HYDRODIURIL) 25 MG tablet Take 1 tablet by mouth once daily  . metFORMIN (GLUCOPHAGE) 1000 MG tablet TAKE 1 TABLET BY MOUTH TWICE DAILY WITH A MEAL   No Known Allergies   Review of Systems    Objective:   BP 132/80 (BP Location: Left Arm, Patient Position: Sitting, Cuff Size: Normal)   Pulse 68   Resp 12   Ht '4\' 8"'$  (1.422 m)   Wt 152 lb (68.9 kg)   LMP 07/25/2019   BMI 34.08 kg/m   Physical Exam Lungs:  CTA CV:  RRR without murmur or rub.  Radial pulses normal and equal LE:  No edema  Assessment & Plan  1.  DM with obesity:  Monthly weigh in with nurse A1C in 3 months OV with me 2days later 1 hour discussing lifestyle change--urged her to make  goals and gradually add to them  2.  Hypertension:  Controlled.

## 2019-07-28 NOTE — Patient Instructions (Signed)
Tome un vaso de agua antes de cada comida Tome un minimo de 6 a 8 vasos de agua diarios Coma tres veces al dia Coma una proteina y Ardelia Mems grasa saludable con comida.  (huevos, pescado, pollo, pavo, y limite carnes rojas Coma 5 porciones diarias de legumbres.  Mezcle los colores Coma 2 porciones diarias de frutas con cascara cuando sea comestible Use platos pequeos Suelte su tenedor o cuchara despues de cada mordida hata que se mastique y se trague Come en la mesa con amigos o familiares por lo menos una vez al dia [place] la televisin y aparatos electrnicos durante la comida  Su objetivo debe ser perder una libra por semana  Estudios recientes indican que las personas quienes consumen todos de sus calorias durante 12 horas se bajan de pesocon Mas eficiencia.  Por ejemplo, si Usted come su primera comida a las 7:00 a.m., su comida final del dia se debe completar antes de las 7:00 p.m.  Make Goals with eating and with physical activity and gradually add

## 2019-08-30 ENCOUNTER — Other Ambulatory Visit: Payer: Self-pay

## 2019-10-30 ENCOUNTER — Other Ambulatory Visit: Payer: Self-pay | Admitting: Internal Medicine

## 2019-11-01 ENCOUNTER — Other Ambulatory Visit: Payer: Self-pay

## 2019-11-01 DIAGNOSIS — E119 Type 2 diabetes mellitus without complications: Secondary | ICD-10-CM

## 2019-11-02 LAB — HGB A1C W/O EAG: Hgb A1c MFr Bld: 8.3 % — ABNORMAL HIGH (ref 4.8–5.6)

## 2019-11-03 ENCOUNTER — Encounter: Payer: Self-pay | Admitting: Internal Medicine

## 2019-11-03 ENCOUNTER — Ambulatory Visit: Payer: Self-pay | Admitting: Internal Medicine

## 2019-11-03 VITALS — BP 128/80 | HR 74 | Resp 12 | Ht <= 58 in | Wt 153.0 lb

## 2019-11-03 DIAGNOSIS — Z6834 Body mass index (BMI) 34.0-34.9, adult: Secondary | ICD-10-CM

## 2019-11-03 DIAGNOSIS — E119 Type 2 diabetes mellitus without complications: Secondary | ICD-10-CM

## 2019-11-03 DIAGNOSIS — I10 Essential (primary) hypertension: Secondary | ICD-10-CM

## 2019-11-03 DIAGNOSIS — E6609 Other obesity due to excess calories: Secondary | ICD-10-CM

## 2019-11-03 NOTE — Progress Notes (Signed)
    Subjective:    Patient ID: Alyssa Garrison, female   DOB: 28-Jan-1976, 44 y.o.   MRN: [id number]   HPI   1.  DM:  A1C remains the same basically at 8.3%.  No change in weight.  Admits she is not eating all day at their family restaurant and then eats a lot of the wrong things when she gets home in the evening.  When she skips meals, she does not take her medication.  Generally happens 3 times or more per week.  2.  COVID vaccine:  Did receive in [place].  Did not bring card today. She is not sure which vaccine she obtained  3.  Hypertension: continues on HCTZ.  Controlleds.  Current Meds  Medication Sig  . aspirin 81 MG tablet Take 1 tablet (81 mg total) by mouth daily.  Marland Kitchen atorvastatin (LIPITOR) 20 MG tablet 1 tab by mouth daily with evening meal  . Blood Glucose Monitoring Suppl (AGAMATRIX PRESTO) w/Device KIT Check sugars twice daily before meals  . glipiZIDE (GLUCOTROL) 10 MG tablet TAKE 1 TABLET BY MOUTH TWICE DAILY WITH MEALS  . glucose blood (AGAMATRIX PRESTO TEST) test strip Check sugars twice daily before meals  . hydrochlorothiazide (HYDRODIURIL) 25 MG tablet Take 1 tablet by mouth once daily  . metFORMIN (GLUCOPHAGE) 1000 MG tablet TAKE 1 TABLET BY MOUTH TWICE DAILY WITH A MEAL   No Known Allergies   Review of Systems    Objective:   BP 128/80 (BP Location: Left Arm, Patient Position: Sitting, Cuff Size: Normal)   Pulse 74   Resp 12   Ht '4\' 8"'$  (1.422 m)   Wt 153 lb (69.4 kg)   LMP 10/20/2019   BMI 34.30 kg/m   Physical Exam  NAD HEENT:  PERRL, EOMI Neck:  Supple, No adenopathy Chest:  CTA CV:  RRR Grade I/VI SEM, No radiation.  Radial and DP pulses normal and equal LE:  No edema  Assessment & Plan   1.  DM:  Would like more time to continue to work on diet, eating at regular intervals and taking her meds regularly before considering medication increase.   Repeat A1C in 3 months, with me 2 days later. Discussed getting her A1C below 7.0%  decreases risk she is causing ongoing damage to vascular system/organs.  2.  Hypertension:  Controlled  3.  HM:  Has reportedly received COVID vaccination.  Will bring in card at next visit.  Hepatitis C screen, urine microalbumin/crea with labs in 3 months.  With me in December for CPE.

## 2020-02-03 ENCOUNTER — Other Ambulatory Visit: Payer: Self-pay

## 2020-02-09 ENCOUNTER — Ambulatory Visit: Payer: Self-pay | Admitting: Internal Medicine

## 2020-03-20 ENCOUNTER — Telehealth: Payer: Self-pay | Admitting: Internal Medicine

## 2020-03-20 NOTE — Telephone Encounter (Signed)
Pt. Called requesting Rx on glipiZIDE (GLUCOTROL) 10 MG tablet to be called in at [facility].

## 2020-03-21 ENCOUNTER — Other Ambulatory Visit: Payer: Self-pay | Admitting: Internal Medicine

## 2020-03-21 DIAGNOSIS — E119 Type 2 diabetes mellitus without complications: Secondary | ICD-10-CM

## 2020-03-28 NOTE — Telephone Encounter (Signed)
Rx was sent to pharmacy by Dr. Amil Amen

## 2020-04-21 ENCOUNTER — Other Ambulatory Visit: Payer: Self-pay | Admitting: Internal Medicine

## 2020-05-03 ENCOUNTER — Encounter: Payer: Self-pay | Admitting: Internal Medicine

## 2020-06-22 ENCOUNTER — Other Ambulatory Visit: Payer: Self-pay | Admitting: Internal Medicine

## 2020-07-17 ENCOUNTER — Other Ambulatory Visit: Payer: Self-pay | Admitting: Internal Medicine

## 2020-08-14 ENCOUNTER — Other Ambulatory Visit: Payer: Self-pay | Admitting: Internal Medicine

## 2020-08-17 ENCOUNTER — Encounter: Payer: Self-pay | Admitting: Internal Medicine

## 2020-08-17 ENCOUNTER — Ambulatory Visit: Payer: Self-pay | Admitting: Internal Medicine

## 2020-08-17 ENCOUNTER — Other Ambulatory Visit: Payer: Self-pay

## 2020-08-17 VITALS — BP 136/73 | HR 69 | Resp 12 | Ht <= 58 in | Wt 150.0 lb

## 2020-08-17 DIAGNOSIS — I10 Essential (primary) hypertension: Secondary | ICD-10-CM

## 2020-08-17 DIAGNOSIS — Z1231 Encounter for screening mammogram for malignant neoplasm of breast: Secondary | ICD-10-CM

## 2020-08-17 DIAGNOSIS — E6609 Other obesity due to excess calories: Secondary | ICD-10-CM

## 2020-08-17 DIAGNOSIS — E119 Type 2 diabetes mellitus without complications: Secondary | ICD-10-CM

## 2020-08-17 DIAGNOSIS — E7800 Pure hypercholesterolemia, unspecified: Secondary | ICD-10-CM | POA: Insufficient documentation

## 2020-08-17 DIAGNOSIS — Z0000 Encounter for general adult medical examination without abnormal findings: Secondary | ICD-10-CM

## 2020-08-17 DIAGNOSIS — Z6834 Body mass index (BMI) 34.0-34.9, adult: Secondary | ICD-10-CM

## 2020-08-17 NOTE — Progress Notes (Signed)
Subjective:    Patient ID: Alyssa Garrison, female   DOB: [birth year], 45 y.o.   MRN: [id number]   HPI   CPE without pap  1.  Pap:  Last performed 12/2017 and normal.  A bit early to have pap done this year.    2.  Mammogram:  Last performed in 05/2017 and normal.  She would like to perform this year.  No family history of breast cancer.   3.  Osteoprevention:  Has 2 servings of yogurt or milk daily.  She can add another serving of milk daily.  Exercises with walking 4 days weekly.  Does dance videos also.    4.  Guaiac Cards:    5.  Colonoscopy:  Never.  No family history of colon cancer.   6.  Immunizations:  Has not had Moderna booster.  7.  Glucose/Cholesterol:  Has not come in for fasting labs since June of 2021.  She is not fasting today.  Checks sugars in the morning and evening.  Fasting in the morning.  Having blood sugars generally in high 100s both times.  Trying to cut out starches.  Replacing with beans, other proteins.  Salads, guacamole.  Current Meds  Medication Sig  . aspirin 81 MG tablet Take 1 tablet (81 mg total) by mouth daily.  Marland Kitchen atorvastatin (LIPITOR) 20 MG tablet TAKE 1 TABLET BY MOUTH ONCE DAILY WITH EVENING MEAL  . Blood Glucose Monitoring Suppl (AGAMATRIX PRESTO) w/Device KIT Check sugars twice daily before meals  . glipiZIDE (GLUCOTROL) 10 MG tablet TAKE 1 TABLET BY MOUTH TWICE DAILY WITH MEALS  . glucose blood (AGAMATRIX PRESTO TEST) test strip Check sugars twice daily before meals  . hydrochlorothiazide (HYDRODIURIL) 25 MG tablet Take 1 tablet by mouth once daily  . metFORMIN (GLUCOPHAGE) 1000 MG tablet TAKE 1 TABLET BY MOUTH TWICE DAILY WITH A MEAL   No Known Allergies   Past Medical History:  Diagnosis Date  . Depression   . Diabetes mellitus without complication ([place])   . Elevated LDL cholesterol level   . Essential hypertension, benign 08/22/2013  . Onychomycosis 03/08/2017  . Onychomycosis of toenail 08/28/2016    Past Surgical  History:  Procedure Laterality Date  . CESAREAN SECTION  2009   Fetal macrosomia  . CESAREAN SECTION  2010    Family History  Problem Relation Age of Onset  . Diabetes Mother   . Hyperlipidemia Mother   . Hypertension Mother   . Peripheral Artery Disease Mother   . Diabetes Paternal Grandmother   . Diabetes Father   . Hyperlipidemia Father   . Hypertension Father   . Heart disease Father        PCTA and stent  . Vascular Disease Father        Right Carotid endarterectomy  . Diabetes Brother   . Kidney disease Brother   . Hypertension Brother   . Anxiety disorder Daughter   . Obesity Daughter   . Autism spectrum disorder Son   . ADD / ADHD Son   . Anxiety disorder Son   . Obesity Son     Social History   Socioeconomic History  . Marital status: Married    Spouse name: Glennie [place]  . Number of children: 2  . Years of education: 74  . Highest education level: Not on file  Occupational History  . Occupation: Housewife    Comment: One day out of week works in a [employer]  . Smoking status:  Never Smoker  . Smokeless tobacco: Never Used  Vaping Use  . Vaping Use: Never used  Substance and Sexual Activity  . Alcohol use: Yes    Alcohol/week: 1.0 standard drink    Types: 1 Shots of liquor per week  . Drug use: No  . Sexual activity: Yes    Birth control/protection: Condom    Comment: every time  Other Topics Concern  . Not on file  Social History Narrative   Lives at home with husband,daughter, and   son      Social Determinants of Health   Financial Resource Strain: Not on file  Food Insecurity: Not on file  Transportation Needs: Not on file  Physical Activity: Not on file  Stress: Not on file  Social Connections: Not on file  Intimate Partner Violence: Not on file      Review of Systems  Eyes: Negative for visual disturbance (No eye exam this year).      Objective:   BP 136/73 (BP Location: Left Arm, Patient Position:  Sitting, Cuff Size: Normal)   Pulse 69   Resp 12   Ht $R'4\' 8"'WV$  (1.422 m)   Wt 150 lb (68 kg)   LMP 08/13/2020 (Exact Date)   BMI 33.63 kg/m   Physical Exam Constitutional:      Appearance: She is obese.  HENT:     Head: Normocephalic and atraumatic.     Right Ear: Tympanic membrane, ear canal and external ear normal.     Left Ear: Tympanic membrane, ear canal and external ear normal.     Nose: Nose normal.     Mouth/Throat:     Lips: Pink.     Mouth: Mucous membranes are moist.     Pharynx: Oropharynx is clear.  Eyes:     Extraocular Movements: Extraocular movements intact.     Conjunctiva/sclera: Conjunctivae normal.     Pupils: Pupils are equal, round, and reactive to light.     Funduscopic exam:    Right eye: Red reflex present.        Left eye: Red reflex present.    Comments: Discs sharp.  Neck:     Thyroid: No thyroid mass or thyromegaly.  Cardiovascular:     Rate and Rhythm: Normal rate and regular rhythm.     Heart sounds: S1 normal and S2 normal. No murmur heard. No friction rub. No S3 or S4 sounds.      Comments: No carotid bruits.  Carotid, radial, femoral, DP and PT pulses normal and equal.  Pulmonary:     Effort: Pulmonary effort is normal.     Breath sounds: Normal breath sounds.  Chest:  Breasts:     Right: No inverted nipple, mass, nipple discharge, skin change, tenderness, axillary adenopathy or supraclavicular adenopathy.     Left: No inverted nipple, mass, nipple discharge, skin change, tenderness, axillary adenopathy or supraclavicular adenopathy.    Abdominal:     General: Bowel sounds are normal.     Palpations: Abdomen is soft. There is no hepatomegaly, splenomegaly or mass.     Tenderness: There is no abdominal tenderness.     Hernia: No hernia is present.  Genitourinary:    Comments: Normal external female genitalia No uterine or adnexal mass or tenderness. Musculoskeletal:        General: Normal range of motion.     Cervical back: Normal  range of motion and neck supple.     Right lower leg: No edema.  Left lower leg: No edema.  Feet:     Comments: Diabetic foot exam was performed with the following findings:   No deformities, ulcerations, or other skin breakdown Normal sensation of 10g monofilament Intact posterior tibialis and dorsalis pedis pulses    Lymphadenopathy:     Head:     Right side of head: No submental or submandibular adenopathy.     Left side of head: No submental or submandibular adenopathy.     Cervical: No cervical adenopathy.     Upper Body:     Right upper body: No supraclavicular or axillary adenopathy.     Left upper body: No supraclavicular or axillary adenopathy.     Lower Body: No right inguinal adenopathy. No left inguinal adenopathy.  Skin:    General: Skin is warm.     Capillary Refill: Capillary refill takes less than 2 seconds.     Findings: No rash.  Neurological:     Mental Status: She is alert.     Cranial Nerves: Cranial nerves are intact.     Sensory: Sensation is intact.     Motor: Motor function is intact.     Coordination: Coordination is intact.     Gait: Gait is intact.     Deep Tendon Reflexes: Reflexes are normal and symmetric.  Psychiatric:        Attention and Perception: Attention normal.        Mood and Affect: Mood normal.        Behavior: Behavior normal. Behavior is cooperative.      Assessment & Plan  1.  CPE without pap Guaiac Cards x 3 to return in 2 weeks when she returns for fasting labs  2. Obesity/DM/Elevated LDL/Hypertension:  Multigenerational issue as her parents with significant complications of same chronic illnesses.  Patient describes a poor diet and lack of physical activity for entire family.  Shares children are obese as well.  Discussed they are at risk of developing DM or other chronic illness if they do not get started with changes at home.  Reportedly, their pediatrician is sending them to a nutritionist.  Discussed with mother how  important it is for she and her husband to be good examples in how they eat and are physically active.   Went over recommendation for eating and physical activity to gradually improve with short term goals.   Urged her to follow up with fasting labs to assess where she is with chronic illness. Hypertension controlled. Eye referral --will send in once she has renewed orange card.

## 2020-09-14 ENCOUNTER — Other Ambulatory Visit: Payer: Self-pay | Admitting: Internal Medicine

## 2020-09-14 DIAGNOSIS — Z1159 Encounter for screening for other viral diseases: Secondary | ICD-10-CM

## 2020-09-14 DIAGNOSIS — E7800 Pure hypercholesterolemia, unspecified: Secondary | ICD-10-CM

## 2020-09-14 DIAGNOSIS — Z79899 Other long term (current) drug therapy: Secondary | ICD-10-CM

## 2020-09-14 DIAGNOSIS — R809 Proteinuria, unspecified: Secondary | ICD-10-CM

## 2020-09-14 DIAGNOSIS — E1129 Type 2 diabetes mellitus with other diabetic kidney complication: Secondary | ICD-10-CM

## 2020-09-15 LAB — COMPREHENSIVE METABOLIC PANEL
ALT: 9 IU/L (ref 0–32)
AST: 14 IU/L (ref 0–40)
Albumin/Globulin Ratio: 1.2 (ref 1.2–2.2)
Albumin: 4.3 g/dL (ref 3.8–4.8)
Alkaline Phosphatase: 66 IU/L (ref 44–121)
BUN/Creatinine Ratio: 13 (ref 9–23)
BUN: 9 mg/dL (ref 6–24)
Bilirubin Total: 0.4 mg/dL (ref 0.0–1.2)
CO2: 23 mmol/L (ref 20–29)
Calcium: 9 mg/dL (ref 8.7–10.2)
Chloride: 99 mmol/L (ref 96–106)
Creatinine, Ser: 0.69 mg/dL (ref 0.57–1.00)
Globulin, Total: 3.6 g/dL (ref 1.5–4.5)
Glucose: 196 mg/dL — ABNORMAL HIGH (ref 65–99)
Potassium: 4.3 mmol/L (ref 3.5–5.2)
Sodium: 138 mmol/L (ref 134–144)
Total Protein: 7.9 g/dL (ref 6.0–8.5)
eGFR: 109 mL/min/{1.73_m2} (ref >59)

## 2020-09-15 LAB — CBC WITH DIFFERENTIAL/PLATELET
Basophils Absolute: 0 10*3/uL (ref 0.0–0.2)
Basos: 1 %
EOS (ABSOLUTE): 0.2 10*3/uL (ref 0.0–0.4)
Eos: 2 %
Hematocrit: 31.1 % — ABNORMAL LOW (ref 34.0–46.6)
Hemoglobin: 9.8 g/dL — ABNORMAL LOW (ref 11.1–15.9)
Immature Grans (Abs): 0 10*3/uL (ref 0.0–0.1)
Immature Granulocytes: 0 %
Lymphocytes Absolute: 2.3 10*3/uL (ref 0.7–3.1)
Lymphs: 30 %
MCH: 23.7 pg — ABNORMAL LOW (ref 26.6–33.0)
MCHC: 31.5 g/dL (ref 31.5–35.7)
MCV: 75 fL — ABNORMAL LOW (ref 79–97)
Monocytes Absolute: 0.6 10*3/uL (ref 0.1–0.9)
Monocytes: 8 %
Neutrophils Absolute: 4.5 10*3/uL (ref 1.4–7.0)
Neutrophils: 59 %
Platelets: 370 10*3/uL (ref 150–450)
RBC: 4.13 x10E6/uL (ref 3.77–5.28)
RDW: 15 % (ref 11.7–15.4)
WBC: 7.6 10*3/uL (ref 3.4–10.8)

## 2020-09-15 LAB — LIPID PANEL W/O CHOL/HDL RATIO
Cholesterol, Total: 130 mg/dL (ref 100–199)
HDL: 52 mg/dL (ref >39)
LDL Chol Calc (NIH): 64 mg/dL (ref 0–99)
Triglycerides: 68 mg/dL (ref 0–149)
VLDL Cholesterol Cal: 14 mg/dL (ref 5–40)

## 2020-09-15 LAB — MICROALBUMIN / CREATININE URINE RATIO
Creatinine, Urine: 145.8 mg/dL
Microalb/Creat Ratio: 76 mg/g creat — ABNORMAL HIGH (ref 0–29)
Microalbumin, Urine: 111.1 ug/mL

## 2020-09-15 LAB — HEPATITIS C ANTIBODY: Hep C Virus Ab: 0.2 s/co ratio (ref 0.0–0.9)

## 2020-09-15 LAB — HGB A1C W/O EAG: Hgb A1c MFr Bld: 9.3 % — ABNORMAL HIGH (ref 4.8–5.6)

## 2020-09-19 ENCOUNTER — Other Ambulatory Visit: Payer: Self-pay | Admitting: Obstetrics and Gynecology

## 2020-09-19 DIAGNOSIS — Z1231 Encounter for screening mammogram for malignant neoplasm of breast: Secondary | ICD-10-CM

## 2020-10-04 ENCOUNTER — Ambulatory Visit: Payer: Self-pay | Admitting: *Deleted

## 2020-10-04 ENCOUNTER — Other Ambulatory Visit: Payer: Self-pay

## 2020-10-04 ENCOUNTER — Ambulatory Visit
Admission: RE | Admit: 2020-10-04 | Discharge: 2020-10-04 | Disposition: A | Discharge status: Home / Self Care | Payer: No Typology Code available for payment source | Source: Ambulatory Visit | Attending: Obstetrics and Gynecology | Admitting: Obstetrics and Gynecology

## 2020-10-04 VITALS — BP 140/86 | Wt 151.9 lb

## 2020-10-04 DIAGNOSIS — Z1231 Encounter for screening mammogram for malignant neoplasm of breast: Secondary | ICD-10-CM

## 2020-10-04 DIAGNOSIS — Z1239 Encounter for other screening for malignant neoplasm of breast: Secondary | ICD-10-CM

## 2020-10-04 NOTE — Progress Notes (Signed)
Ms. Hebah Bogosian is a 45 y.o. female who presents to [facility] clinic today with no complaints.    Pap Smear: Pap smear not completed today. Last Pap smear was 12/24/2017 at [facility] clinic and was normal. Per patient has no history of an abnormal Pap smear. Last Pap smear result is available in Epic.   Physical exam: Breasts Breasts symmetrical. No skin abnormalities bilateral breasts. No nipple retraction bilateral breasts. No nipple discharge bilateral breasts. No lymphadenopathy. No lumps palpated bilateral breasts. No complaints of pain or tenderness on exam.      MM DIGITAL SCREENING BILATERAL  Result Date: 10/18/2015 CLINICAL DATA:  Screening. EXAM: DIGITAL SCREENING BILATERAL MAMMOGRAM WITH CAD COMPARISON:  Previous exam(s). ACR Breast Density Category b: There are scattered areas of fibroglandular density. FINDINGS: There are no findings suspicious for malignancy. Images were processed with CAD. IMPRESSION: No mammographic evidence of malignancy. A result letter of this screening mammogram will be mailed directly to the patient. RECOMMENDATION: Screening mammogram in one year. (Code:SM-B-01Y) BI-RADS CATEGORY  1: Negative. Electronically Signed   By: Evangeline Dakin M.D.   On: 10/23/2015 12:22   MS DIGITAL SCREENING BILATERAL  Result Date: 05/21/2017 CLINICAL DATA:  Screening. EXAM: DIGITAL SCREENING BILATERAL MAMMOGRAM WITH CAD COMPARISON:  Previous exam(s). ACR Breast Density Category b: There are scattered areas of fibroglandular density. FINDINGS: There are no findings suspicious for malignancy. Images were processed with CAD. IMPRESSION: No mammographic evidence of malignancy. A result letter of this screening mammogram will be mailed directly to the patient. RECOMMENDATION: Screening mammogram in one year. (Code:SM-B-01Y) BI-RADS CATEGORY  1: Negative. Electronically Signed   By: Lajean Manes M.D.   On: 05/21/2017 08:38   Pelvic/Bimanual Pap is not indicated today per B[facility]CP  guidelines.   Smoking History: Patient has never smoked.   Patient Navigation: Patient education provided. Access to services provided for patient through [place] program. Spanish interpreter Rudene Anda from [facility] provided.   Colorectal Cancer Screening: Per patient has never had colonoscopy completed. No complaints today.    Breast and Cervical Cancer Risk Assessment: Patient does not have family history of breast cancer, known genetic mutations, or radiation treatment to the chest before age 15. Patient does not have history of cervical dysplasia, immunocompromised, or DES exposure in-utero.  Risk Assessment    Risk Scores      10/04/2020   Last edited by: Royston Bake, CMA   5-year risk: 1.3 %   Lifetime risk: 11.2 %          A: [facility]CP exam without pap smear No complaints.  P: Referred patient to the [place] for a screening mammogram on mobile unit. Appointment scheduled Thursday, Oct 04, 2020 at 1150.  Loletta Parish, RN 10/04/2020 10:54 AM

## 2020-10-04 NOTE — Patient Instructions (Signed)
Explained breast self awareness with Alyssa Garrison. Patient did not need a Pap smear today due to last Pap smear was 12/24/2017. Let her know [facility]CP will cover Pap smears every 3 years unless has a history of abnormal Pap smears and that her next Pap smear is due in August 2022. Informed patient that the Pap smear is free through [facility] and to call a couple months ahead to schedule appointment. Referred patient to the [place] for a screening mammogram on mobile unit. Appointment scheduled Thursday, Oct 04, 2020 at 1150. Patient escorted to the mobile unit following B[facility]CP appointment for a screening mammogram. Let patient know the [facility] will follow up with her within the next couple weeks with results of her mammogram by letter or phone. Alyssa Garrison verbalized understanding.  Nikash Mortensen, Arvil Chaco, RN 10:54 AM

## 2020-11-21 ENCOUNTER — Other Ambulatory Visit (INDEPENDENT_AMBULATORY_CARE_PROVIDER_SITE_OTHER): Payer: Self-pay

## 2020-11-21 DIAGNOSIS — Z1211 Encounter for screening for malignant neoplasm of colon: Secondary | ICD-10-CM

## 2020-11-21 LAB — POC HEMOCCULT BLD/STL (HOME/3-CARD/SCREEN)
Card #2 Fecal Occult Blod, POC: POSITIVE
Card #3 Fecal Occult Blood, POC: NEGATIVE
Fecal Occult Blood, POC: POSITIVE — AB

## 2020-11-26 ENCOUNTER — Other Ambulatory Visit: Payer: Self-pay | Admitting: Internal Medicine

## 2020-11-26 DIAGNOSIS — D649 Anemia, unspecified: Secondary | ICD-10-CM

## 2020-11-26 DIAGNOSIS — R195 Other fecal abnormalities: Secondary | ICD-10-CM

## 2020-11-26 MED ORDER — FERROUS GLUCONATE 324 (38 FE) MG PO TABS: 324.0000 mg | ORAL_TABLET | Freq: Two times a day (BID) | ORAL | 3 refills | Status: AC

## 2020-11-30 ENCOUNTER — Other Ambulatory Visit: Payer: Self-pay | Admitting: Internal Medicine

## 2020-11-30 DIAGNOSIS — E119 Type 2 diabetes mellitus without complications: Secondary | ICD-10-CM

## 2021-01-01 ENCOUNTER — Other Ambulatory Visit: Payer: Self-pay

## 2021-01-01 MED ORDER — HYDROCHLOROTHIAZIDE 25 MG PO TABS: 25.0000 mg | ORAL_TABLET | Freq: Every day | ORAL | 0 refills | Status: DC

## 2021-01-01 MED ORDER — ATORVASTATIN CALCIUM 20 MG PO TABS: ORAL_TABLET | ORAL | 0 refills | Status: DC

## 2021-01-09 ENCOUNTER — Other Ambulatory Visit: Payer: Self-pay

## 2021-01-09 DIAGNOSIS — R809 Proteinuria, unspecified: Secondary | ICD-10-CM

## 2021-01-09 DIAGNOSIS — E1129 Type 2 diabetes mellitus with other diabetic kidney complication: Secondary | ICD-10-CM

## 2021-01-10 LAB — HEMOGLOBIN A1C
Est. average glucose Bld gHb Est-mCnc: 200 mg/dL
Hgb A1c MFr Bld: 8.6 % — ABNORMAL HIGH (ref 4.8–5.6)

## 2021-01-11 ENCOUNTER — Encounter: Payer: Self-pay | Admitting: Internal Medicine

## 2021-01-11 ENCOUNTER — Ambulatory Visit: Payer: Self-pay | Admitting: Internal Medicine

## 2021-01-11 ENCOUNTER — Other Ambulatory Visit: Payer: Self-pay

## 2021-01-11 VITALS — BP 118/82 | HR 76 | Resp 16 | Ht <= 58 in | Wt 155.0 lb

## 2021-01-11 DIAGNOSIS — D649 Anemia, unspecified: Secondary | ICD-10-CM

## 2021-01-11 DIAGNOSIS — I10 Essential (primary) hypertension: Secondary | ICD-10-CM

## 2021-01-11 DIAGNOSIS — R195 Other fecal abnormalities: Secondary | ICD-10-CM

## 2021-01-11 DIAGNOSIS — E119 Type 2 diabetes mellitus without complications: Secondary | ICD-10-CM

## 2021-01-11 LAB — GLUCOSE, POCT (MANUAL RESULT ENTRY): POC Glucose: 251 mg/dl — AB (ref 70–99)

## 2021-01-11 NOTE — Progress Notes (Signed)
    Subjective:    Patient ID: Alyssa Garrison, female   DOB: [birth year], 45 y.o.   MRN: [id number]   HPI  Alyssa Garrison interprets   DM:  A1C down to 8.6% now.  Long discussion in April about entire family making lifestyle changes.  Family has worked on diet as well as physical activity over the summer.    2.  Obesity:  As above.  Encouraged participation in [facility] program, but not clear she is interested.    3.  Iron Deficiency Anemia:  Has appt 10/6 with GI for colonoscopy and is taking iron twice daily.    4.  HM:  Has had 3 COVID vaccines--booster done here.    Current Meds  Medication Sig   aspirin 81 MG tablet Take 1 tablet (81 mg total) by mouth daily.   atorvastatin (LIPITOR) 20 MG tablet TAKE 1 TABLET BY MOUTH ONCE DAILY WITH EVENING MEAL   Blood Glucose Monitoring Suppl (AGAMATRIX PRESTO) w/Device KIT Check sugars twice daily before meals   ferrous gluconate (FERGON) 324 MG tablet Take 1 tablet (324 mg total) by mouth 2 (two) times daily with a meal.   glipiZIDE (GLUCOTROL) 10 MG tablet TAKE 1 TABLET BY MOUTH TWICE DAILY WITH MEALS   glucose blood (AGAMATRIX PRESTO TEST) test strip Check sugars twice daily before meals   hydrochlorothiazide (HYDRODIURIL) 25 MG tablet Take 1 tablet (25 mg total) by mouth daily.   metFORMIN (GLUCOPHAGE) 1000 MG tablet TAKE 1 TABLET BY MOUTH TWICE DAILY WITH A MEAL    No Known Allergies   Review of Systems    Objective:   BP 118/82 (BP Location: Left Arm, Patient Position: Sitting, Cuff Size: Normal)   Pulse 76   Resp 16   Ht $R'4\' 8"'fP$  (1.422 m)   Wt 155 lb (70.3 kg)   BMI 34.75 kg/m   Physical Exam NAD HEENT:  PERRL, EOMI Neck:  Supple, No adenopathy, not thyromegaly Chest:  CTA CV:  RRR without murmur or rub.  Radial and DP pulses normal and equal Abd:  S, NT, No HSM or mass, + BS LE:  No edema.   Assessment & Plan   DM:  improved control.  Continue to work on lifestyle changes.  2.  Obesity:  will ask Alyssa Garrison, CHW to contact her regarding [place], but does not seem interested.  3.  Heme + stool with iron def anemia:  has appt with GI for colonoscopy.  4.  Hypertension:  controlled.

## 2021-01-16 ENCOUNTER — Other Ambulatory Visit: Payer: Self-pay

## 2021-01-16 ENCOUNTER — Other Ambulatory Visit: Payer: Self-pay | Admitting: *Deleted

## 2021-01-16 DIAGNOSIS — Z1231 Encounter for screening mammogram for malignant neoplasm of breast: Secondary | ICD-10-CM

## 2021-01-16 NOTE — Progress Notes (Signed)
Patient: Alyssa Garrison           Date of Birth: [birth year]           MRN: [id number] Visit Date: 01/16/2021 PCP: Mack Hook, MD  Cervical Cancer Screening Do you smoke?: No Have you ever had or been told you have an allergy to latex products?: No Marital status: Married Date of last pap smear: 2-5 yrs ago (12/24/2017-negative ([facility])) Date of last menstrual period: 01/10/21 Number of pregnancies: 2 Number of births: 2 Have you ever had any of the following? Hysterectomy: No Tubal ligation (tubes tied): No Abnormal bleeding: No Abnormal pap smear: No Venereal warts: No A sex partner with venereal warts: No A high risk* sex partner: No  Cervical Exam  Abnormal Observations: Normal Exam. Recommendations: Last Pap smear was 12/24/2017 at [facility] clinic and was normal. Per patient has no history of an abnormal Pap smear. Last Pap smear result is available in Epic. Let patient know that if today's Pap smear is normal and HPV negative that her next Pap smear is due in 5 years. Informed patient that will follow-up with her within the next couple of weeks with results of her Pap smear by phone. Patient verbalized understanding.   Used Spanish interpreter [employer] from [place].  Patient's History Patient Active Problem List   Diagnosis Date Noted   Elevated LDL cholesterol level    Generalized anxiety disorder 01/25/2019   Cutaneous abscess of right lower extremity 10/28/2018   Epidermoid cyst 08/31/2018   Onychomycosis 03/08/2017   Onychomycosis of toenail 08/28/2016   Obesity 08/15/2015   Microalbuminuria 12/31/2014   Essential hypertension, benign 08/22/2013   DM (diabetes mellitus), type 2 with renal complications ([place]) 99991111   BEN NEOPLASM SKIN UPPER LIMB INCLUDING SHOULDER 12/13/2007   BREAST MASS, LEFT 11/15/2007   Past Medical History:  Diagnosis Date   Depression    Diabetes mellitus without complication ([facility])    Elevated LDL  cholesterol level    Essential hypertension, benign 08/22/2013   Onychomycosis 03/08/2017   Onychomycosis of toenail 08/28/2016    Family History  Problem Relation Age of Onset   Diabetes Mother    Hyperlipidemia Mother    Hypertension Mother    Peripheral Artery Disease Mother    Diabetes Paternal Grandmother    Diabetes Father    Hyperlipidemia Father    Hypertension Father    Heart disease Father        PCTA and stent   Vascular Disease Father        Right Carotid endarterectomy   Diabetes Brother    Kidney disease Brother    Hypertension Brother    Anxiety disorder Daughter    Obesity Daughter    Autism spectrum disorder Son    ADD / ADHD Son    Anxiety disorder Son    Obesity Son    Breast cancer Neg Hx     Social History   Occupational History   Occupation: Housewife    Comment: One day out of week works in a [employer]  Tobacco Use   Smoking status: Never   Smokeless tobacco: Never  Vaping Use   Vaping Use: Never used  Substance and Sexual Activity   Alcohol use: Yes    Alcohol/week: 1.0 standard drink    Types: 1 Shots of liquor per week   Drug use: No   Sexual activity: Yes    Birth control/protection: Condom    Comment: every time

## 2021-01-17 LAB — CYTOLOGY - PAP
Adequacy: ABSENT
Comment: NEGATIVE
Diagnosis: NEGATIVE
High risk HPV: NEGATIVE

## 2021-01-18 ENCOUNTER — Telehealth: Payer: Self-pay

## 2021-01-18 NOTE — Progress Notes (Signed)
Please send prescription to treat BV and call patient. This patient is a current B[facility]CP patient.

## 2021-01-18 NOTE — Telephone Encounter (Signed)
Attempted to call patient via [employer] to give pap results. Left name and number for patient to call back.

## 2021-01-22 ENCOUNTER — Telehealth: Payer: Self-pay

## 2021-01-22 ENCOUNTER — Other Ambulatory Visit: Payer: Self-pay

## 2021-01-22 DIAGNOSIS — B9689 Other specified bacterial agents as the cause of diseases classified elsewhere: Secondary | ICD-10-CM

## 2021-01-22 DIAGNOSIS — N760 Acute vaginitis: Secondary | ICD-10-CM

## 2021-01-22 MED ORDER — METRONIDAZOLE 500 MG PO TABS: 500.0000 mg | ORAL_TABLET | Freq: Two times a day (BID) | ORAL | 0 refills | Status: AC

## 2021-01-22 NOTE — Telephone Encounter (Signed)
Called patient via interpreter Alyssa Garrison, [employer] to give pap results. Informed patient that pap was normal and HPV was negative. Next pap will be due in 5 years. Patient voiced understanding. Informed patient that pap showed bacterial vaginosis. Explained to patient that we will prescribe her Flagyl to take BID for 7 days. No alcohol while taking this medication. Patient voiced understanding. Prescription sent to [facility]

## 2021-01-27 ENCOUNTER — Other Ambulatory Visit: Payer: Self-pay | Admitting: Internal Medicine

## 2021-01-31 ENCOUNTER — Other Ambulatory Visit: Payer: Self-pay

## 2021-04-16 ENCOUNTER — Other Ambulatory Visit: Payer: Self-pay

## 2021-04-16 DIAGNOSIS — E119 Type 2 diabetes mellitus without complications: Secondary | ICD-10-CM

## 2021-04-18 ENCOUNTER — Encounter: Payer: Self-pay | Admitting: Internal Medicine

## 2021-04-18 ENCOUNTER — Ambulatory Visit: Payer: Self-pay | Admitting: Internal Medicine

## 2021-04-18 VITALS — BP 138/82 | HR 72 | Resp 16 | Ht <= 58 in | Wt 153.0 lb

## 2021-04-18 NOTE — Progress Notes (Signed)
    Subjective:    Patient ID: Alyssa Garrison, female   DOB: [birth year], 45 y.o.   MRN: [id number]   HPI   New onset anemia: Found back in April, still having periods, difficult to say if periods heavier, she thinks the first 2 days are generally heavier than used to be.  Also in follow up, had 2 of 3 guaiac cards + for blood.  Went to [place] for consult recently through [facility] and then told they would not perform the colonoscopy through the orange card.  Waiting to hear direction from orange card, but sounds like only avenue will be going through [place] and then applying for financial assistance.    2.  DM:  Unable to find A1C drawn 2 days ago.  She does not feel her sugars have been better.      Current Meds  Medication Sig   aspirin 81 MG tablet Take 1 tablet (81 mg total) by mouth daily.   atorvastatin (LIPITOR) 20 MG tablet TAKE 1 TABLET BY MOUTH ONCE DAILY WITH EVENING MEAL   Blood Glucose Monitoring Suppl (AGAMATRIX PRESTO) w/Device KIT Check sugars twice daily before meals   ferrous gluconate (FERGON) 324 MG tablet Take 1 tablet (324 mg total) by mouth 2 (two) times daily with a meal.   glipiZIDE (GLUCOTROL) 10 MG tablet TAKE 1 TABLET BY MOUTH TWICE DAILY WITH MEALS   glucose blood (AGAMATRIX PRESTO TEST) test strip Check sugars twice daily before meals   hydrochlorothiazide (HYDRODIURIL) 25 MG tablet Take 1 tablet by mouth once daily   metFORMIN (GLUCOPHAGE) 1000 MG tablet TAKE 1 TABLET BY MOUTH TWICE DAILY WITH A MEAL   No Known Allergies   Review of Systems    Objective:   BP 138/82 (BP Location: Left Arm, Patient Position: Sitting, Cuff Size: Normal)   Pulse 72   Resp 16   Ht _0  (1.422 m)   Wt 153 lb (69.4 kg)   LMP 04/07/2021   BMI 34.30 kg/m   Physical Exam   Assessment & Plan   No charge for today as no labs to evaluate.   Will check to see what happened with A1C, see if can add CBC Contact [place] to see if can work on diagnostic colonoscopies  with them in meantime. Call into [facility]:  Tamsen Meek will call back to my phone.

## 2021-04-22 LAB — HEMOGLOBIN A1C

## 2021-04-23 ENCOUNTER — Other Ambulatory Visit: Payer: Self-pay

## 2021-04-23 ENCOUNTER — Other Ambulatory Visit (INDEPENDENT_AMBULATORY_CARE_PROVIDER_SITE_OTHER): Payer: Self-pay

## 2021-04-23 DIAGNOSIS — E119 Type 2 diabetes mellitus without complications: Secondary | ICD-10-CM

## 2021-04-23 DIAGNOSIS — D649 Anemia, unspecified: Secondary | ICD-10-CM

## 2021-04-24 LAB — HEMOGLOBIN A1C
Est. average glucose Bld gHb Est-mCnc: 240 mg/dL
Hgb A1c MFr Bld: 10 % — ABNORMAL HIGH (ref 4.8–5.6)

## 2021-04-24 LAB — CBC WITH DIFFERENTIAL/PLATELET
Basophils Absolute: 0 10*3/uL (ref 0.0–0.2)
Basos: 0 %
EOS (ABSOLUTE): 0.2 10*3/uL (ref 0.0–0.4)
Eos: 3 %
Hematocrit: 36.9 % (ref 34.0–46.6)
Hemoglobin: 12.5 g/dL (ref 11.1–15.9)
Immature Grans (Abs): 0.1 10*3/uL (ref 0.0–0.1)
Immature Granulocytes: 1 %
Lymphocytes Absolute: 2 10*3/uL (ref 0.7–3.1)
Lymphs: 30 %
MCH: 29.8 pg (ref 26.6–33.0)
MCHC: 33.9 g/dL (ref 31.5–35.7)
MCV: 88 fL (ref 79–97)
Monocytes Absolute: 0.5 10*3/uL (ref 0.1–0.9)
Monocytes: 8 %
Neutrophils Absolute: 3.9 10*3/uL (ref 1.4–7.0)
Neutrophils: 58 %
Platelets: 271 10*3/uL (ref 150–450)
RBC: 4.2 x10E6/uL (ref 3.77–5.28)
RDW: 12.9 % (ref 11.7–15.4)
WBC: 6.6 10*3/uL (ref 3.4–10.8)

## 2021-04-29 MED ORDER — SITAGLIPTIN PHOSPHATE 50 MG PO TABS: 50.0000 mg | ORAL_TABLET | Freq: Every day | ORAL | 11 refills | Status: DC

## 2021-04-29 NOTE — Addendum Note (Signed)
Addended by: Marcelino Duster on: 04/29/2021 10:23 AM   Modules accepted: Orders

## 2021-05-07 ENCOUNTER — Encounter: Payer: Self-pay | Admitting: Internal Medicine

## 2021-05-07 ENCOUNTER — Other Ambulatory Visit: Payer: Self-pay

## 2021-05-07 ENCOUNTER — Ambulatory Visit: Payer: Self-pay | Admitting: Internal Medicine

## 2021-05-07 VITALS — BP 134/74 | HR 76 | Resp 16 | Ht <= 58 in | Wt 154.0 lb

## 2021-05-07 DIAGNOSIS — E6609 Other obesity due to excess calories: Secondary | ICD-10-CM

## 2021-05-07 DIAGNOSIS — Z6834 Body mass index (BMI) 34.0-34.9, adult: Secondary | ICD-10-CM

## 2021-05-07 DIAGNOSIS — D649 Anemia, unspecified: Secondary | ICD-10-CM

## 2021-05-07 DIAGNOSIS — E119 Type 2 diabetes mellitus without complications: Secondary | ICD-10-CM

## 2021-05-07 DIAGNOSIS — R195 Other fecal abnormalities: Secondary | ICD-10-CM

## 2021-05-07 DIAGNOSIS — Z23 Encounter for immunization: Secondary | ICD-10-CM

## 2021-05-07 DIAGNOSIS — I10 Essential (primary) hypertension: Secondary | ICD-10-CM

## 2021-05-07 NOTE — Patient Instructions (Signed)
Metas!!!!!  Tome un vaso de agua antes de cada comida Tome un minimo de 6 a 8 vasos de agua diarios Coma tres veces al dia Coma una proteina y Ardelia Mems grasa saludable con comida.  (huevos, pescado, pollo, pavo, y limite carnes rojas Coma 5 porciones diarias de legumbres.  Mezcle los colores Coma 2 porciones diarias de frutas con cascara cuando sea comestible Use platos pequeos Suelte su tenedor o cuchara despues de cada mordida hata que se mastique y se trague Come en la mesa con amigos o familiares por lo menos una vez al dia [place] la televisin y aparatos electrnicos durante la comida  Su objetivo debe ser perder una libra por semana  Estudios recientes indican que las personas quienes consumen todos de sus calorias durante 12 horas se bajan de pesocon Mas eficiencia.  Por ejemplo, si Usted come su primera comida a las 7:00 a.m., su comida final del dia se debe completar antes de las 7:00 p.m.

## 2021-05-07 NOTE — Progress Notes (Signed)
    Subjective:    Patient ID: Alyssa Garrison, female   DOB: 09-Jun-1975, 45 y.o.   MRN: [id number]   HPI  Alyssa Garrison interprets   DM:  did not see My Chart message to get started on januvia as A1C is up to 10%.  8.6% in August.  She admits her diet is not good.  She states she is taking her medication appropriately in recent months, however.   Breakfast 8-9 a.m. [employer]--Sausage McMuffin.  Coffee with sugar and cream.  Otherwise goes to [employer].  Once a week has something similar.  Rest of week:  beans and eggs, or whatever left over from night before. 3 corn tortillas.  Coffee with powdered creamer.  May have 2% milk instead.   Drinks water and may snack on an apple or mandarin.  Granola bar.    Does not eat again until 4 to 5 p.m.:  chicken with rice, 3 tortillas.  Beans.  Salsa.  Soda--Fanta.    Sometimes in the night, will have corn flakes with the strawberry bits.  2.  When urinates and then wipes--notes brown on tissue.  When urinates another time, no discharge.  Has noted for past 1.5 to 2 weeks.  LMP first day today.  No dysuria.  No urinary frequency.  She does state she normally has this at the end of her period.  No cramping.    3.  Anemia:  resolved with iron supplementation.  Have not been able to get her in for colonoscopy through [facility].    Current Meds  Medication Sig   aspirin 81 MG tablet Take 1 tablet (81 mg total) by mouth daily.   atorvastatin (LIPITOR) 20 MG tablet TAKE 1 TABLET BY MOUTH ONCE DAILY WITH EVENING MEAL   Blood Glucose Monitoring Suppl (AGAMATRIX PRESTO) w/Device KIT Check sugars twice daily before meals   ferrous gluconate (FERGON) 324 MG tablet Take 1 tablet (324 mg total) by mouth 2 (two) times daily with a meal.   glipiZIDE (GLUCOTROL) 10 MG tablet TAKE 1 TABLET BY MOUTH TWICE DAILY WITH MEALS   glucose blood (AGAMATRIX PRESTO TEST) test strip Check sugars twice daily before meals   hydrochlorothiazide (HYDRODIURIL) 25 MG tablet Take 1  tablet by mouth once daily   metFORMIN (GLUCOPHAGE) 1000 MG tablet TAKE 1 TABLET BY MOUTH TWICE DAILY WITH A MEAL   No Known Allergies   Review of Systems    Objective:   BP 134/74 (BP Location: Left Arm, Patient Position: Sitting, Cuff Size: Normal)   Pulse 76   Resp 16   Ht $R'4\' 8"'zI$  (1.422 m)   Wt 154 lb (69.9 kg)   LMP 05/07/2021   BMI 34.53 kg/m   Physical Exam NAD  Assessment & Plan    DM/obesity:  To get in to [facility] to sign up for Januvia and get started.  Encouraged her to make better dietary choices and get started with physical activity.  Needs to make small incremental weekly goals.   2.  Spotting:  unable to evaluate today as having period.  Suspect she is having some spotting. To call if brown continues after period done for 1 week.  3.  Heme Pos stool with history of iron deficiency anemia:  Not clear if can get folks into diagnostic colonoscopy through [facility].  Checking in with [facility].    4.  Hypertension:  controlled.  5.  HM: influenza and COVID vaccines today.

## 2021-09-20 ENCOUNTER — Other Ambulatory Visit: Payer: Self-pay

## 2021-09-20 DIAGNOSIS — E119 Type 2 diabetes mellitus without complications: Secondary | ICD-10-CM

## 2021-09-20 MED ORDER — GLIPIZIDE 10 MG PO TABS: 10.0000 mg | ORAL_TABLET | Freq: Two times a day (BID) | ORAL | 6 refills | Status: DC

## 2021-11-12 ENCOUNTER — Other Ambulatory Visit: Payer: Self-pay

## 2021-11-12 DIAGNOSIS — E7800 Pure hypercholesterolemia, unspecified: Secondary | ICD-10-CM

## 2021-11-12 DIAGNOSIS — E119 Type 2 diabetes mellitus without complications: Secondary | ICD-10-CM

## 2021-11-13 LAB — HEMOGLOBIN A1C
Est. average glucose Bld gHb Est-mCnc: 214 mg/dL
Hgb A1c MFr Bld: 9.1 % — ABNORMAL HIGH (ref 4.8–5.6)

## 2021-11-13 LAB — LIPID PANEL W/O CHOL/HDL RATIO
Cholesterol, Total: 149 mg/dL (ref 100–199)
HDL: 55 mg/dL (ref >39)
LDL Chol Calc (NIH): 73 mg/dL (ref 0–99)
Triglycerides: 115 mg/dL (ref 0–149)
VLDL Cholesterol Cal: 21 mg/dL (ref 5–40)

## 2021-11-14 ENCOUNTER — Other Ambulatory Visit: Payer: Self-pay

## 2021-11-21 ENCOUNTER — Ambulatory Visit: Payer: Self-pay | Admitting: Internal Medicine

## 2021-11-22 ENCOUNTER — Encounter: Payer: Self-pay | Admitting: Internal Medicine

## 2021-11-22 ENCOUNTER — Ambulatory Visit: Payer: Self-pay | Admitting: Internal Medicine

## 2021-11-22 VITALS — BP 128/70 | HR 72 | Resp 12 | Ht <= 58 in | Wt 155.0 lb

## 2021-11-22 DIAGNOSIS — I10 Essential (primary) hypertension: Secondary | ICD-10-CM

## 2021-11-22 DIAGNOSIS — E119 Type 2 diabetes mellitus without complications: Secondary | ICD-10-CM

## 2021-11-22 DIAGNOSIS — E6609 Other obesity due to excess calories: Secondary | ICD-10-CM

## 2021-11-22 DIAGNOSIS — Z6834 Body mass index (BMI) 34.0-34.9, adult: Secondary | ICD-10-CM

## 2021-11-22 DIAGNOSIS — E7800 Pure hypercholesterolemia, unspecified: Secondary | ICD-10-CM

## 2021-11-22 NOTE — Progress Notes (Signed)
Subjective:    Patient ID: Alyssa Garrison, female   DOB: 11-Jun-1975, 46 y.o.   MRN: [id number]   HPI  Alyssa Garrison interprets   DM:  still not taking Januvia.  States she has been unable to complete application as she has no paperwork showing a bill for her residence under her name.  A1C is a bit better at 9.1 %.  She states her main problem in controlling her blood sugars is she cannot stop eating.  She has gained a pound since last visit. States she was able to give up pan dulce over [place] and does not crave it now.  She will take a bite if offered, but does not otherwise eat.  They do not have in the home very much at all now--though father, also a patient here, will still bring in from time to time, but much smaller supply.   Her next weakness is tortillas.  Eats 6 tortillas daily.  Discussed making her own [place] Pea every 40 days and working on another food issue she has.    2.  Hyperlipidemia:   About the same as last check, though numbers up a bit.  LDL slightly above 70   Lipid Panel     Component Value Date/Time   CHOL 149 11/12/2021 0859   TRIG 115 11/12/2021 0859   HDL 55 11/12/2021 0859   CHOLHDL 2.9 05/15/2015 0902   VLDL 26 05/15/2015 0902   LDLCALC 73 11/12/2021 0859   LABVLDL 21 11/12/2021 0859   States she forgets her Atorvastatin when she goes out of town and has been out a couple times recently.   Current Meds  Medication Sig   aspirin 81 MG tablet Take 1 tablet (81 mg total) by mouth daily.   atorvastatin (LIPITOR) 20 MG tablet TAKE 1 TABLET BY MOUTH ONCE DAILY WITH EVENING MEAL   Blood Glucose Monitoring Suppl (AGAMATRIX PRESTO) w/Device KIT Check sugars twice daily before meals   ferrous gluconate (FERGON) 324 MG tablet Take 1 tablet (324 mg total) by mouth 2 (two) times daily with a meal.   glipiZIDE (GLUCOTROL) 10 MG tablet Take 1 tablet (10 mg total) by mouth 2 (two) times daily with a meal.   glucose blood (AGAMATRIX PRESTO TEST) test strip Check  sugars twice daily before meals   hydrochlorothiazide (HYDRODIURIL) 25 MG tablet Take 1 tablet by mouth once daily   metFORMIN (GLUCOPHAGE) 1000 MG tablet TAKE 1 TABLET BY MOUTH TWICE DAILY WITH A MEAL   No Known Allergies   Review of Systems    Objective:   BP 128/70 (BP Location: Right Arm, Patient Position: Sitting, Cuff Size: Normal)   Pulse 72   Resp 12   Ht 4\' 8"  (1.422 m)   Wt 155 lb (70.3 kg)   LMP 10/30/2021   BMI 34.75 kg/m   Physical Exam NAD HEENT:  PERRL, EOMI Neck:  Supple, No adenopathy Chest:  CTA CV:  RRR without murmur or rub.  Radial and DP pulses normal and equal Abd:  S, NT, No HSM or mass.  + BS  Diabetic foot exam was performed with the following findings:   No deformities, ulcerations, or other skin breakdown Normal sensation of 10g monofilament Intact posterior tibialis and dorsalis pedis pulses       Assessment & Plan    DM/obesity: Again, discussed goal making to meet objectives with her health, but not sure she is motivated.  Needs to complete application for Januvia.  A1C in 3  months.  2.  Hypertension:  controlled.  3.  Elevated LDL:  to take statin regularly with repeat FLP in 3 months.  2.

## 2021-12-21 ENCOUNTER — Other Ambulatory Visit: Payer: Self-pay | Admitting: Internal Medicine

## 2022-02-20 ENCOUNTER — Other Ambulatory Visit: Payer: Self-pay

## 2022-02-27 ENCOUNTER — Ambulatory Visit: Payer: Self-pay | Admitting: Internal Medicine

## 2022-02-28 ENCOUNTER — Other Ambulatory Visit: Payer: Self-pay | Admitting: Internal Medicine

## 2022-03-20 ENCOUNTER — Other Ambulatory Visit: Payer: Self-pay

## 2022-03-27 ENCOUNTER — Ambulatory Visit: Payer: Self-pay | Admitting: Internal Medicine

## 2022-03-27 ENCOUNTER — Other Ambulatory Visit: Payer: Self-pay | Admitting: Internal Medicine

## 2022-04-30 ENCOUNTER — Other Ambulatory Visit: Payer: Self-pay | Admitting: Internal Medicine

## 2022-04-30 DIAGNOSIS — E119 Type 2 diabetes mellitus without complications: Secondary | ICD-10-CM

## 2022-05-02 ENCOUNTER — Other Ambulatory Visit: Payer: Self-pay

## 2022-05-02 DIAGNOSIS — E7800 Pure hypercholesterolemia, unspecified: Secondary | ICD-10-CM

## 2022-05-02 DIAGNOSIS — E119 Type 2 diabetes mellitus without complications: Secondary | ICD-10-CM

## 2022-05-09 ENCOUNTER — Ambulatory Visit: Payer: Self-pay | Admitting: Internal Medicine

## 2022-05-20 ENCOUNTER — Encounter: Payer: Self-pay | Admitting: Internal Medicine

## 2022-05-20 ENCOUNTER — Ambulatory Visit: Payer: Self-pay | Admitting: Internal Medicine

## 2022-05-20 VITALS — BP 138/78 | HR 80 | Resp 20 | Ht <= 58 in | Wt 156.0 lb

## 2022-05-20 DIAGNOSIS — E119 Type 2 diabetes mellitus without complications: Secondary | ICD-10-CM

## 2022-05-20 DIAGNOSIS — E7800 Pure hypercholesterolemia, unspecified: Secondary | ICD-10-CM

## 2022-05-20 DIAGNOSIS — Z6834 Body mass index (BMI) 34.0-34.9, adult: Secondary | ICD-10-CM

## 2022-05-20 DIAGNOSIS — Z23 Encounter for immunization: Secondary | ICD-10-CM

## 2022-05-20 DIAGNOSIS — E6609 Other obesity due to excess calories: Secondary | ICD-10-CM

## 2022-05-20 DIAGNOSIS — I10 Essential (primary) hypertension: Secondary | ICD-10-CM

## 2022-05-20 MED ORDER — SITAGLIPTIN PHOSPHATE 100 MG PO TABS: ORAL_TABLET | ORAL | 11 refills | Status: DC

## 2022-05-20 NOTE — Progress Notes (Signed)
    Subjective:    Patient ID: Alyssa Garrison, female   DOB: Mar 10, 1976, 47 y.o.   MRN: [id number]   HPI  [name] interprets   DM:  Has been taking Januvia 50 mg with her glipizide and Metformin since a few days after last seen here in July.  A1C is better, but still not at goal at 8.6%.  She and her family are typically not particularly compliant.  She feels the Januvia is making her hungry and constantly snacking.    2.  Elevated LDL:  Excellent control with Atorvastatin. Lipid Panel     Component Value Date/Time   CHOL 125 05/02/2022 0855   TRIG 94 05/02/2022 0855   HDL 50 05/02/2022 0855   CHOLHDL 2.9 05/15/2015 0902   VLDL 26 05/15/2015 0902   LDLCALC 57 05/02/2022 0855   LABVLDL 18 05/02/2022 0855   3.  HM:  Has not received influenza or COVID vaccine.    Current Meds  Medication Sig   aspirin 81 MG tablet Take 1 tablet (81 mg total) by mouth daily.   atorvastatin (LIPITOR) 20 MG tablet TAKE 1 TABLET BY MOUTH ONCE DAILY WITH EVENING MEAL   Blood Glucose Monitoring Suppl (AGAMATRIX PRESTO) w/Device KIT Check sugars twice daily before meals   ferrous gluconate (FERGON) 324 MG tablet Take 1 tablet (324 mg total) by mouth 2 (two) times daily with a meal.   glipiZIDE (GLUCOTROL) 10 MG tablet TAKE 1 TABLET BY MOUTH TWICE DAILY WITH A MEAL   glucose blood (AGAMATRIX PRESTO TEST) test strip Check sugars twice daily before meals   hydrochlorothiazide (HYDRODIURIL) 25 MG tablet Take 1 tablet by mouth once daily   metFORMIN (GLUCOPHAGE) 1000 MG tablet TAKE 1 TABLET BY MOUTH TWICE DAILY WITH A MEAL   sitaGLIPtin (JANUVIA) 50 MG tablet Take 1 tablet (50 mg total) by mouth daily.   No Known Allergies   Review of Systems    Objective:   BP 138/78 (BP Location: Right Arm, Patient Position: Sitting, Cuff Size: Normal)   Pulse 80   Resp 20   Ht _0  (1.422 m)   Wt 156 lb (70.8 kg)   BMI 34.97 kg/m   Physical Exam NAD Lungs:  CTA CV:  RRR without murmur or rub.   Radial pulses normal and equal LE:  No edema.   Assessment & Plan    DM:  Improved control, though not yet at goal.  Discussed options:  increasing Januvia, though concerned she feels she is snacking more with this, switch to another med, such as Jardiance.  Also to work on diet and physical activity.  She prefers to increase Januvia and will let me know if hunger/snacking increases.  Repeat A1C in 3 to 4 months.    2.  Elevated LDL:  at goal now with consistent use of Atorvastatin.    3.  HM:  influenza and Spikevax COVID vaccines today.

## 2022-05-21 LAB — MICROALBUMIN / CREATININE URINE RATIO: Microalbumin, Urine: 4.7 ug/mL

## 2022-05-21 LAB — LIPID PANEL W/O CHOL/HDL RATIO
Cholesterol, Total: 125 mg/dL (ref 100–199)
HDL: 50 mg/dL (ref >39)
LDL Chol Calc (NIH): 57 mg/dL (ref 0–99)
Triglycerides: 94 mg/dL (ref 0–149)
VLDL Cholesterol Cal: 18 mg/dL (ref 5–40)

## 2022-05-21 LAB — HEMOGLOBIN A1C
Est. average glucose Bld gHb Est-mCnc: 200 mg/dL
Hgb A1c MFr Bld: 8.6 % — ABNORMAL HIGH (ref 4.8–5.6)

## 2022-08-19 ENCOUNTER — Other Ambulatory Visit: Payer: Self-pay

## 2022-08-19 DIAGNOSIS — E119 Type 2 diabetes mellitus without complications: Secondary | ICD-10-CM

## 2022-08-20 LAB — HEMOGLOBIN A1C
Est. average glucose Bld gHb Est-mCnc: 217 mg/dL
Hgb A1c MFr Bld: 9.2 % — ABNORMAL HIGH (ref 4.8–5.6)

## 2022-11-25 ENCOUNTER — Ambulatory Visit: Payer: Self-pay | Admitting: Internal Medicine

## 2022-11-25 ENCOUNTER — Encounter: Payer: Self-pay | Admitting: Internal Medicine

## 2022-11-25 VITALS — BP 130/80 | HR 79 | Resp 16 | Ht <= 58 in | Wt 152.0 lb

## 2022-11-25 DIAGNOSIS — Z0000 Encounter for general adult medical examination without abnormal findings: Secondary | ICD-10-CM

## 2022-11-25 DIAGNOSIS — E66811 Other obesity due to excess calories: Secondary | ICD-10-CM

## 2022-11-25 DIAGNOSIS — R195 Other fecal abnormalities: Secondary | ICD-10-CM

## 2022-11-25 DIAGNOSIS — E119 Type 2 diabetes mellitus without complications: Secondary | ICD-10-CM

## 2022-11-25 DIAGNOSIS — E6609 Other obesity due to excess calories: Secondary | ICD-10-CM

## 2022-11-25 DIAGNOSIS — E7800 Pure hypercholesterolemia, unspecified: Secondary | ICD-10-CM

## 2022-11-25 DIAGNOSIS — Z1231 Encounter for screening mammogram for malignant neoplasm of breast: Secondary | ICD-10-CM

## 2022-11-25 DIAGNOSIS — Z6834 Body mass index (BMI) 34.0-34.9, adult: Secondary | ICD-10-CM

## 2022-11-25 DIAGNOSIS — D649 Anemia, unspecified: Secondary | ICD-10-CM

## 2022-11-25 DIAGNOSIS — H43393 Other vitreous opacities, bilateral: Secondary | ICD-10-CM

## 2022-11-25 DIAGNOSIS — I10 Essential (primary) hypertension: Secondary | ICD-10-CM

## 2022-11-25 NOTE — Progress Notes (Signed)
Subjective:    Patient ID: Alyssa Garrison, female   DOB: Mar 17, 1976, 47 y.o.   MRN: [id number]   HPI  CPE without pap  1.  Pap:  normal 12/2020  2.  Mammogram: Last 09/2020 and normal.  No family history of breast cancer.    3.  Osteoprevention:  drinks milk, but not frequently.  Some cheese.  Zumba 4 days weekly  4.  Guaiac Cards/FIT:   1 of 3 cards positive along with anemia back in 2022.  She was referred for colonoscopy, but ultimately GI refused to perform with her orange card.  Anemia resolved with iron supplementation within same year.  5.  Colonoscopy:  Never.  As above, tried to set her up, but GI was not honoring the orange card for the actual procedure, only for the set up.  We now have this available.    6.  Immunizations:  up to date Immunization History  Administered Date(s) Administered   Covid-19, Mrna,Vaccine(Spikevax)76yrs and older 05/20/2022   Influenza Inj Mdck Quad Pf 05/15/2017, 04/01/2018, 05/20/2022   Influenza Inj Mdck Quad With Preservative 05/07/2021   Influenza Split 01/24/2013   Influenza Whole 03/14/2008   Influenza,inj,Quad PF,6+ Mos 06/16/2014, 04/03/2015   Influenza-Unspecified 01/18/2019, 02/08/2020   Moderna SARS-COV2 Booster Vaccination 08/17/2020   Moderna Sars-Covid-2 Vaccination 07/29/2019, 08/26/2019   Pfizer Covid-19 Vaccine Bivalent Booster 45yrs & up 05/07/2021   Pneumococcal Polysaccharide-23 08/28/2016   Tdap 05/03/2015     7.  Glucose/Cholesterol:  A1C increased to 9.2% in April.  She did not want to change medications--wanted to work on dietary and other lifestyle changes.  She states she decreased her bread intake only.  Down 4 lbs from January.   Last cholesterol panel at goal with Atorvastatin.   Lipid Panel     Component Value Date/Time   CHOL 125 05/02/2022 0855   TRIG 94 05/02/2022 0855   HDL 50 05/02/2022 0855   CHOLHDL 2.9 05/15/2015 0902   VLDL 26 05/15/2015 0902   LDLCALC 57 05/02/2022 0855   LABVLDL  18 05/02/2022 0855      Current Meds  Medication Sig   aspirin 81 MG tablet Take 1 tablet (81 mg total) by mouth daily.   atorvastatin (LIPITOR) 20 MG tablet TAKE 1 TABLET BY MOUTH ONCE DAILY WITH EVENING MEAL   Blood Glucose Monitoring Suppl (AGAMATRIX PRESTO) w/Device KIT Check sugars twice daily before meals   ferrous gluconate (FERGON) 324 MG tablet Take 1 tablet (324 mg total) by mouth 2 (two) times daily with a meal.   glipiZIDE (GLUCOTROL) 10 MG tablet TAKE 1 TABLET BY MOUTH TWICE DAILY WITH A MEAL   glucose blood (AGAMATRIX PRESTO TEST) test strip Check sugars twice daily before meals   hydrochlorothiazide (HYDRODIURIL) 25 MG tablet Take 1 tablet by mouth once daily   metFORMIN (GLUCOPHAGE) 1000 MG tablet TAKE 1 TABLET BY MOUTH TWICE DAILY WITH A MEAL   sitaGLIPtin (JANUVIA) 100 MG tablet 1 tab by mouth daily with breakfast.   No Known Allergies  Past Medical History:  Diagnosis Date   Depression    Diabetes mellitus without complication (HCC)    Elevated LDL cholesterol level    Essential hypertension, benign 08/22/2013   Onychomycosis 03/08/2017   Onychomycosis of toenail 08/28/2016   Past Surgical History:  Procedure Laterality Date   BREAST BIOPSY Left 06/08/2008   Fat necrosis   CESAREAN SECTION  2009   Fetal macrosomia   CESAREAN SECTION  2010   Family History  Problem Relation Age of Onset   Diabetes Mother    Hyperlipidemia Mother    Hypertension Mother    Peripheral Artery Disease Mother    Diabetes Paternal Grandmother    Diabetes Father    Hyperlipidemia Father    Hypertension Father    Heart disease Father        PCTA and stent   Vascular Disease Father        Right Carotid endarterectomy   Diabetes Brother    Kidney disease Brother    Hypertension Brother    Anxiety disorder Daughter    Obesity Daughter    Autism spectrum disorder Son    ADD / ADHD Son    Anxiety disorder Son    Obesity Son    Breast cancer Neg Hx    Social History    Socioeconomic History   Marital status: Married    Spouse name: Graciella Freer   Number of children: 2   Years of education: 12   Highest education level: Not on file  Occupational History   Occupation: Housewife    Comment: Three days out of week works in a [employer]  Tobacco Use   Smoking status: Never    Passive exposure: Never   Smokeless tobacco: Never  Vaping Use   Vaping Use: Never used  Substance and Sexual Activity   Alcohol use: Not Currently    Comment: occasional   Drug use: No   Sexual activity: Yes    Birth control/protection: Condom    Comment: every time  Other Topics Concern   Not on file  Social History Narrative   Lives at home with husband,daughter, and   son      Social Determinants of Health   Financial Resource Strain: Low Risk  (11/25/2022)   Overall Financial Resource Strain (CARDIA)    Difficulty of Paying Living Expenses: Not hard at all  Food Insecurity: No Food Insecurity (11/25/2022)   Hunger Vital Sign    Worried About Running Out of Food in the Last Year: Never true    Ran Out of Food in the Last Year: Never true  Transportation Needs: No Transportation Needs (11/25/2022)   PRAPARE - Administrator, Civil Service (Medical): No    Lack of Transportation (Non-Medical): No  Physical Activity: Sufficiently Active (12/24/2017)   Exercise Vital Sign    Days of Exercise per Week: 7 days    Minutes of Exercise per Session: 50 min  Stress: Stress Concern Present (12/24/2017)   [employer] of Occupational Health - Occupational Stress Questionnaire    Feeling of Stress : Rather much  Social Connections: Unknown (04/21/2019)   Social Connection and Isolation Panel [NHANES]    Frequency of Communication with Friends and Family: Not on file    Frequency of Social Gatherings with Friends and Family: Not on file    Attends Religious Services: Not on file    Active Member of Clubs or Organizations: Not on file    Attends Tax inspector Meetings: Not on file    Marital Status: Married  Intimate Partner Violence: Not At Risk (11/25/2022)   Humiliation, Afraid, Rape, and Kick questionnaire    Fear of Current or Ex-Partner: No    Emotionally Abused: No    Physically Abused: No    Sexually Abused: No      Review of Systems  Eyes:  Positive for visual disturbance (Floaters).  Respiratory:  Negative for shortness of breath.   Cardiovascular:  Negative for chest pain and leg swelling.  Gastrointestinal:  Negative for abdominal pain and blood in stool (no melena).  Neurological:  Negative for weakness and numbness.      Objective:   BP 130/80 (BP Location: Left Arm, Patient Position: Sitting, Cuff Size: Normal)   Pulse 79   Resp 16   Ht 4' 8.5" (1.435 m)   Wt 152 lb (68.9 kg)   BMI 33.48 kg/m   Physical Exam HENT:     Head: Normocephalic and atraumatic.     Right Ear: Tympanic membrane, ear canal and external ear normal.     Left Ear: Tympanic membrane, ear canal and external ear normal.     Nose: Nose normal.     Mouth/Throat:     Mouth: Mucous membranes are moist.     Pharynx: Oropharynx is clear.  Eyes:     Extraocular Movements: Extraocular movements intact.     Pupils: Pupils are equal, round, and reactive to light.     Comments: Discs sharp  Cardiovascular:     Rate and Rhythm: Normal rate and regular rhythm.     Pulses:          Dorsalis pedis pulses are 2+ on the right side and 2+ on the left side.       Posterior tibial pulses are 2+ on the right side and 2+ on the left side.     Heart sounds: S1 normal and S2 normal. No murmur heard.    No friction rub. No S3 or S4 sounds.     Comments: No carotid bruits.  Carotid, radial, femoral, DP and PT pulses normal and equal.   Pulmonary:     Effort: Pulmonary effort is normal.     Breath sounds: Normal breath sounds and air entry.  Chest:  Breasts:    Right: No inverted nipple, mass or nipple discharge.     Left: No inverted nipple,  mass or nipple discharge.  Abdominal:     General: Bowel sounds are normal.     Palpations: Abdomen is soft. There is no hepatomegaly, splenomegaly or mass.     Tenderness: There is no abdominal tenderness.     Hernia: No hernia is present.  Genitourinary:    General: Normal vulva.     Comments: No uterine or adnexal mass or tenderness Musculoskeletal:        General: Normal range of motion.     Cervical back: Normal range of motion and neck supple.     Right lower leg: No edema.     Left lower leg: No edema.  Feet:     Right foot:     Protective Sensation: 10 sites tested.  10 sites sensed.     Skin integrity: Skin integrity normal.     Left foot:     Protective Sensation: 10 sites tested.  10 sites sensed.     Skin integrity: Skin integrity normal.     Toenail Condition: Left toenails are normal.  Lymphadenopathy:     Head:     Right side of head: No submental or submandibular adenopathy.     Left side of head: No submental or submandibular adenopathy.     Cervical: No cervical adenopathy.     Upper Body:     Right upper body: No supraclavicular or axillary adenopathy.     Left upper body: No supraclavicular or axillary adenopathy.     Lower Body: No right inguinal adenopathy. No left inguinal adenopathy.  Skin:  General: Skin is warm.     Capillary Refill: Capillary refill takes less than 2 seconds.     Findings: No rash.  Neurological:     General: No focal deficit present.     Mental Status: She is alert and oriented to person, place, and time.     Cranial Nerves: Cranial nerves 2-12 are intact.     Sensory: Sensation is intact.     Motor: Motor function is intact.     Coordination: Coordination is intact.     Gait: Gait is intact.     Deep Tendon Reflexes: Reflexes are normal and symmetric.  Psychiatric:        Speech: Speech normal.        Behavior: Behavior normal. Behavior is cooperative.        Thought Content: Thought content normal.    Assessment & Plan   CPE without pap Mammogram  2.  Hx of iron deficiency anemia and heme + stool   from 2022:  will see if can get in for diagnostic colonoscopy.  Will need to apply for financial assistance with [facility].  CBC and FIT.  3.  DM:  not controlled.  Not clear she is making changes.  A1C, urine microalbumin/Crea.  Diabetic eye exam.  4.  Hypertension:  fair control  5.  Elevated LDL:  FLP

## 2022-11-26 LAB — CBC WITH DIFFERENTIAL/PLATELET
Basophils Absolute: 0.1 10*3/uL (ref 0.0–0.2)
Basos: 1 %
EOS (ABSOLUTE): 0.2 10*3/uL (ref 0.0–0.4)
Eos: 3 %
Hematocrit: 37.2 % (ref 34.0–46.6)
Hemoglobin: 11.8 g/dL (ref 11.1–15.9)
Immature Grans (Abs): 0 10*3/uL (ref 0.0–0.1)
Immature Granulocytes: 0 %
Lymphocytes Absolute: 2 10*3/uL (ref 0.7–3.1)
Lymphs: 32 %
MCH: 27.8 pg (ref 26.6–33.0)
MCHC: 31.7 g/dL (ref 31.5–35.7)
MCV: 88 fL (ref 79–97)
Monocytes Absolute: 0.5 10*3/uL (ref 0.1–0.9)
Monocytes: 8 %
Neutrophils Absolute: 3.6 10*3/uL (ref 1.4–7.0)
Neutrophils: 56 %
Platelets: 362 10*3/uL (ref 150–450)
RBC: 4.24 x10E6/uL (ref 3.77–5.28)
RDW: 12.7 % (ref 11.7–15.4)
WBC: 6.4 10*3/uL (ref 3.4–10.8)

## 2022-11-26 LAB — HGB A1C W/O EAG: Hgb A1c MFr Bld: 8.7 % — ABNORMAL HIGH (ref 4.8–5.6)

## 2022-11-26 LAB — LIPID PANEL W/O CHOL/HDL RATIO
Cholesterol, Total: 170 mg/dL (ref 100–199)
HDL: 59 mg/dL (ref >39)
LDL Chol Calc (NIH): 90 mg/dL (ref 0–99)
Triglycerides: 120 mg/dL (ref 0–149)
VLDL Cholesterol Cal: 21 mg/dL (ref 5–40)

## 2022-11-26 LAB — COMPREHENSIVE METABOLIC PANEL
ALT: 16 IU/L (ref 0–32)
AST: 20 IU/L (ref 0–40)
Albumin: 4.2 g/dL (ref 3.9–4.9)
Alkaline Phosphatase: 76 IU/L (ref 44–121)
BUN/Creatinine Ratio: 16 (ref 9–23)
BUN: 10 mg/dL (ref 6–24)
Bilirubin Total: 0.2 mg/dL (ref 0.0–1.2)
CO2: 25 mmol/L (ref 20–29)
Calcium: 9.2 mg/dL (ref 8.7–10.2)
Chloride: 101 mmol/L (ref 96–106)
Creatinine, Ser: 0.61 mg/dL (ref 0.57–1.00)
Globulin, Total: 3.3 g/dL (ref 1.5–4.5)
Glucose: 154 mg/dL — ABNORMAL HIGH (ref 70–99)
Potassium: 3.8 mmol/L (ref 3.5–5.2)
Sodium: 139 mmol/L (ref 134–144)
Total Protein: 7.5 g/dL (ref 6.0–8.5)
eGFR: 111 mL/min/{1.73_m2} (ref >59)

## 2022-11-26 LAB — MICROALBUMIN / CREATININE URINE RATIO
Creatinine, Urine: 48 mg/dL
Microalb/Creat Ratio: <6 mg/g creat (ref 0–29)
Microalbumin, Urine: <3 ug/mL

## 2023-02-03 ENCOUNTER — Other Ambulatory Visit: Payer: Self-pay

## 2023-02-03 DIAGNOSIS — E119 Type 2 diabetes mellitus without complications: Secondary | ICD-10-CM

## 2023-02-03 MED ORDER — GLIPIZIDE 10 MG PO TABS: 10.0000 mg | ORAL_TABLET | Freq: Two times a day (BID) | ORAL | 10 refills | Status: DC

## 2023-02-03 MED ORDER — HYDROCHLOROTHIAZIDE 25 MG PO TABS: 25.0000 mg | ORAL_TABLET | Freq: Every day | ORAL | 10 refills | Status: DC

## 2023-03-30 ENCOUNTER — Other Ambulatory Visit: Payer: Self-pay | Admitting: Internal Medicine

## 2023-04-10 ENCOUNTER — Other Ambulatory Visit: Payer: Self-pay | Admitting: Internal Medicine

## 2023-05-28 ENCOUNTER — Other Ambulatory Visit: Payer: Self-pay

## 2023-05-28 DIAGNOSIS — E119 Type 2 diabetes mellitus without complications: Secondary | ICD-10-CM

## 2023-05-28 LAB — HM DIABETES EYE EXAM

## 2023-05-29 LAB — HEMOGLOBIN A1C
Est. average glucose Bld gHb Est-mCnc: 232 mg/dL
Hgb A1c MFr Bld: 9.7 % — ABNORMAL HIGH (ref 4.8–5.6)

## 2023-06-01 ENCOUNTER — Ambulatory Visit: Payer: Self-pay | Admitting: Internal Medicine

## 2023-07-06 MED ORDER — EMPAGLIFLOZIN 10 MG PO TABS: 10.0000 mg | ORAL_TABLET | Freq: Every day | ORAL | 11 refills | Status: AC

## 2023-07-06 MED ORDER — OZEMPIC (0.25 OR 0.5 MG/DOSE) 2 MG/3ML ~~LOC~~ SOPN: PEN_INJECTOR | SUBCUTANEOUS | 11 refills | Status: DC

## 2023-07-06 NOTE — Addendum Note (Signed)
 Addended by: Marcene Duos on: 07/06/2023 04:30 PM   Modules accepted: Orders

## 2023-07-07 ENCOUNTER — Telehealth: Payer: Self-pay

## 2023-07-07 NOTE — Telephone Encounter (Signed)
 Patient needs an appointment for ringing in the ear. Patient stated ringing started a day ago and it comes and goes,   We will call patient if there is a cancellation.

## 2023-07-09 NOTE — Telephone Encounter (Signed)
 Called patient to offer appointment, patient stated she no longer needs appointment .

## 2023-07-31 ENCOUNTER — Other Ambulatory Visit: Payer: Self-pay

## 2023-07-31 DIAGNOSIS — E119 Type 2 diabetes mellitus without complications: Secondary | ICD-10-CM

## 2023-07-31 NOTE — Progress Notes (Signed)
 Patient has been on medication for 2 weeks. Patient does not have any questions or concerns.   Per PCP, patient has been instructed to take Glipizide 10 mg in the morning and 5mg  in the evening. Patient has been made aware of change.

## 2023-08-04 MED ORDER — GLIPIZIDE 10 MG PO TABS: ORAL_TABLET | ORAL | 10 refills | Status: DC

## 2023-08-10 ENCOUNTER — Telehealth: Payer: Self-pay

## 2023-08-10 NOTE — Telephone Encounter (Signed)
 Telephoned patient at mobile number using interpreter#[phone]. Left a voice message with [facility] (scholarship) contact information.

## 2023-08-20 ENCOUNTER — Ambulatory Visit
Admission: RE | Admit: 2023-08-20 | Discharge: 2023-08-20 | Disposition: A | Discharge status: Home / Self Care | Source: Ambulatory Visit | Attending: Internal Medicine | Admitting: Internal Medicine

## 2023-08-20 DIAGNOSIS — Z1231 Encounter for screening mammogram for malignant neoplasm of breast: Secondary | ICD-10-CM

## 2023-08-27 ENCOUNTER — Encounter: Payer: Self-pay | Admitting: Internal Medicine

## 2023-08-27 ENCOUNTER — Ambulatory Visit: Payer: Self-pay | Admitting: Internal Medicine

## 2023-08-27 VITALS — BP 138/80 | HR 73 | Resp 16 | Ht <= 58 in | Wt 142.0 lb

## 2023-08-27 DIAGNOSIS — H6991 Unspecified Eustachian tube disorder, right ear: Secondary | ICD-10-CM

## 2023-08-27 DIAGNOSIS — E119 Type 2 diabetes mellitus without complications: Secondary | ICD-10-CM

## 2023-08-27 MED ORDER — OZEMPIC (0.25 OR 0.5 MG/DOSE) 2 MG/3ML ~~LOC~~ SOPN: PEN_INJECTOR | SUBCUTANEOUS | 11 refills | Status: DC

## 2023-08-27 MED ORDER — ATORVASTATIN CALCIUM 20 MG PO TABS: ORAL_TABLET | ORAL | Status: DC

## 2023-08-27 NOTE — Progress Notes (Signed)
    Subjective:    Patient ID: Alyssa Garrison, female   DOB: 1976/01/06, 48 y.o.   MRN: [id number]   HPI  Alyssa Garrison interprets   DM:  Sugars generally much better.  Was running in high 200s at times when started Ozempic  and Jardiance .  Now generally into low to mid 100s with occasional low 200. She admits to eating too late or too many carbs with a meal when glucose jumps above 200.  She also misses evening meds at times.   Has lost 4.5 lbs since starting Ozempic .   Not clear she completed her application for MAP  2.  Right ear:  1 month ago, sense of fullness as if water filling ear.  Once this went away, her ear became sensitive to all sounds, whether loud or not.  No discomfort without sound.   Current Meds  Medication Sig   aspirin  81 MG tablet Take 1 tablet (81 mg total) by mouth daily.   atorvastatin  (LIPITOR) 20 MG tablet TAKE 1 TABLET BY MOUTH ONCE DAILY WITH EVENING MEAL   Blood Glucose Monitoring Suppl (AGAMATRIX PRESTO) w/Device KIT Check sugars twice daily before meals   empagliflozin  (JARDIANCE ) 10 MG TABS tablet Take 1 tablet (10 mg total) by mouth daily before breakfast.   ferrous gluconate  (FERGON) 324 MG tablet Take 1 tablet (324 mg total) by mouth 2 (two) times daily with a meal. (Patient taking differently: Take 324 mg by mouth daily with breakfast.)   glipiZIDE  (GLUCOTROL ) 10 MG tablet Take 1 tab (10mg ) in the morning and 0.5 tab (5mg ) in the evening with a meal.   glucose blood (AGAMATRIX PRESTO TEST) test strip Check sugars twice daily before meals   hydrochlorothiazide  (HYDRODIURIL ) 25 MG tablet Take 1 tablet (25 mg total) by mouth daily.   metFORMIN  (GLUCOPHAGE ) 1000 MG tablet TAKE 1 TABLET BY MOUTH TWICE DAILY WITH A MEAL   Semaglutide ,0.25 or 0.5MG /DOS, (OZEMPIC , 0.25 OR 0.5 MG/DOSE,) 2 MG/3ML SOPN 0.25 mg injected subcutaneously once weekly   No Known Allergies   Review of Systems    Objective:   BP 138/80 (BP Location: Left Arm, Patient  Position: Sitting, Cuff Size: Normal)   Pulse 73   Resp 16   Ht 4' 8.5" (1.435 m)   Wt 142 lb (64.4 kg)   LMP 08/18/2023   BMI 31.27 kg/m   Physical Exam NAD HEENT:  PERRL, EOMI, L TM pearly gray, Right TM dull, slightly retracted, throat without injection Neck:  Supple, No adenopathy Chest:  CTA CV:  RRR without murmur or rub.  Radial and DP pulses normal and equal Abd:  S, NT, No HSM or mass, + BS LE:  No edema.   Assessment & Plan   DM:  increase Semaglutide  to 0.5 mg once weekly.  To call if sugars dropping below 90 as will start to wean Glipizide .  Encouraged her to be certain to take her meds.  Call into MAP to be certain she completed application for Ozempic .  Follow up in 2 weeks for weight check and bring in sugars  2.  Right eustachian tube dysfunction:  discussed should resolve with time--to give it a month.  Consider starting Loratadine, but generally will not speed the process.

## 2023-08-27 NOTE — Patient Instructions (Signed)
 Call office if sugars dropping below 90

## 2023-09-14 ENCOUNTER — Other Ambulatory Visit: Payer: Self-pay

## 2023-09-14 NOTE — Progress Notes (Unsigned)
 Patient came to bring in sugar levels and weight check  Per Dr. Jayne Mews:  find out how she has been taking all of her diabetic meds (4) She should be on 0.5 mg of Ozempic --find out when she made the change. Could she have missed any meds? Concerned as her weight went up and sugars not great.

## 2023-09-25 NOTE — Progress Notes (Signed)
 Patient reports taking medication for a month now and using medications properly   Appt on 10/01 but will have patient to come sooner to talk about meds

## 2023-10-02 ENCOUNTER — Other Ambulatory Visit: Payer: Self-pay

## 2023-10-16 ENCOUNTER — Other Ambulatory Visit: Payer: Self-pay

## 2023-11-27 ENCOUNTER — Other Ambulatory Visit: Payer: Self-pay | Admitting: Internal Medicine

## 2023-12-04 ENCOUNTER — Other Ambulatory Visit: Payer: Self-pay

## 2023-12-04 DIAGNOSIS — E119 Type 2 diabetes mellitus without complications: Secondary | ICD-10-CM

## 2023-12-05 LAB — HEMOGLOBIN A1C
Est. average glucose Bld gHb Est-mCnc: 186 mg/dL
Hgb A1c MFr Bld: 8.1 % — ABNORMAL HIGH (ref 4.8–5.6)

## 2024-01-21 ENCOUNTER — Other Ambulatory Visit: Payer: Self-pay | Admitting: Internal Medicine

## 2024-02-08 ENCOUNTER — Ambulatory Visit: Payer: Self-pay | Admitting: Internal Medicine

## 2024-02-17 ENCOUNTER — Ambulatory Visit: Payer: Self-pay | Admitting: Internal Medicine

## 2024-02-17 ENCOUNTER — Encounter: Payer: Self-pay | Admitting: Internal Medicine

## 2024-02-17 ENCOUNTER — Other Ambulatory Visit: Payer: Self-pay | Admitting: Internal Medicine

## 2024-02-17 VITALS — BP 118/80 | HR 72 | Resp 18 | Ht <= 58 in | Wt 136.0 lb

## 2024-02-17 DIAGNOSIS — Z0120 Encounter for dental examination and cleaning without abnormal findings: Secondary | ICD-10-CM

## 2024-02-17 DIAGNOSIS — N898 Other specified noninflammatory disorders of vagina: Secondary | ICD-10-CM

## 2024-02-17 DIAGNOSIS — E119 Type 2 diabetes mellitus without complications: Secondary | ICD-10-CM

## 2024-02-17 DIAGNOSIS — Z0000 Encounter for general adult medical examination without abnormal findings: Secondary | ICD-10-CM

## 2024-02-17 DIAGNOSIS — Z124 Encounter for screening for malignant neoplasm of cervix: Secondary | ICD-10-CM

## 2024-02-17 DIAGNOSIS — I10 Essential (primary) hypertension: Secondary | ICD-10-CM

## 2024-02-17 DIAGNOSIS — Z23 Encounter for immunization: Secondary | ICD-10-CM

## 2024-02-17 LAB — POCT WET PREP WITH KOH
KOH Prep POC: NEGATIVE
RBC Wet Prep HPF POC: NEGATIVE
Trichomonas, UA: NEGATIVE

## 2024-02-17 MED ORDER — SEMAGLUTIDE (1 MG/DOSE) 4 MG/3ML ~~LOC~~ SOPN: 1.0000 mg | PEN_INJECTOR | SUBCUTANEOUS | 11 refills | Status: DC

## 2024-02-17 MED ORDER — COVID-19 MRNA VACC (MODERNA) 50 MCG/0.5ML IM SUSY: 0.5000 mL | PREFILLED_SYRINGE | Freq: Once | INTRAMUSCULAR | 0 refills | Status: DC

## 2024-02-17 MED ORDER — METRONIDAZOLE 500 MG PO TABS: 500.0000 mg | ORAL_TABLET | Freq: Two times a day (BID) | ORAL | 0 refills | Status: AC

## 2024-02-17 MED ORDER — TERBINAFINE HCL 1 % EX CREA: 1.0000 | TOPICAL_CREAM | Freq: Two times a day (BID) | CUTANEOUS | Status: AC

## 2024-02-17 MED ORDER — FLUCONAZOLE 150 MG PO TABS: ORAL_TABLET | ORAL | 0 refills | Status: AC

## 2024-02-17 NOTE — Progress Notes (Signed)
 Subjective:    Patient ID: Alyssa Garrison, female   DOB: Oct 06, 1975, 48 y.o.   MRN: [id number]   HPI  Erminio Bloomer interprets  CPE with pap  1.  Pap:  Last in 12/2020 and normal.  Always normal.  2.  Mammogram:  Last in 08/2023 and normal.  No family history of breast cancer.    3.  Osteoprevention:  Has 1 serving of milk products daily.  Able to drink 2% milk 3 times daily and will implement.  She walks on a treadmill and some sort of stairmaster type thing she uses 3 times weekly for 35 minutes.  Not outside much.    4.  Guaiac Cards/FIT:  + in 11/2020.  1 of 3 cards  5.  Colonoscopy:  Never.  Was referred to [facility] through [facility], but 3 days before planned colonoscopy they cancelled the procedure.  Was for anemia and 1/3 cards + for blood.    6.  Immunizations:  Needs COVID. Immunization History  Administered Date(s) Administered   Influenza Inj Mdck Quad Pf 05/15/2017, 04/01/2018, 05/20/2022   Influenza Inj Mdck Quad With Preservative 05/07/2021   Influenza Split 01/24/2013   Influenza Whole 03/14/2008   Influenza, Seasonal, Injecte, Preservative Fre 02/12/2024   Influenza,inj,Quad PF,6+ Mos 06/16/2014, 04/03/2015   Influenza-Unspecified 01/18/2019, 02/08/2020, 02/06/2023   Moderna Covid-19 Fall Seasonal Vaccine 58yrs & older 05/20/2022   Moderna SARS-COV2 Booster Vaccination 08/17/2020   Moderna Sars-Covid-2 Vaccination 07/29/2019, 08/26/2019   Pfizer Covid-19 Vaccine Bivalent Booster 33yrs & up 05/07/2021   Pneumococcal Polysaccharide-23 08/28/2016   Tdap 04/24/2009, 05/03/2015     7.  Glucose/Cholesterol:  A1C last checked in July and down to 8.1%.  She has not followed up as recommended.  She works at [employer] in the evening and forgets to take her glucometer and is afraid she will leave it there, so not checking pre dinner sugars.  Morning sugars area generally 78 to 174.  Also forgets to take Metformin  and Glipizide  in the evening with meal.   No  cholesterol done this year.   Lipid Panel     Component Value Date/Time   CHOL 170 11/25/2022 0950   TRIG 120 11/25/2022 0950   HDL 59 11/25/2022 0950   CHOLHDL 2.9 05/15/2015 0902   VLDL 26 05/15/2015 0902   LDLCALC 90 11/25/2022 0950   LABVLDL 21 11/25/2022 0950     Current Meds  Medication Sig   aspirin  81 MG tablet Take 1 tablet (81 mg total) by mouth daily.   atorvastatin  (LIPITOR) 20 MG tablet TAKE 1 TABLET BY MOUTH ONCE DAILY WITH Morning MEAL   Blood Glucose Monitoring Suppl (AGAMATRIX PRESTO) w/Device KIT Check sugars twice daily before meals   empagliflozin  (JARDIANCE ) 10 MG TABS tablet Take 1 tablet (10 mg total) by mouth daily before breakfast.   glipiZIDE  (GLUCOTROL ) 10 MG tablet Take 1 tab (10mg ) in the morning and 0.5 tab (5mg ) in the evening with a meal.   glucose blood (AGAMATRIX PRESTO TEST) test strip Check sugars twice daily before meals   hydrochlorothiazide  (HYDRODIURIL ) 25 MG tablet Take 1 tablet by mouth once daily   metFORMIN  (GLUCOPHAGE ) 1000 MG tablet TAKE 1 TABLET BY MOUTH TWICE DAILY WITH A MEAL   Semaglutide ,0.25 or 0.5MG /DOS, (OZEMPIC , 0.25 OR 0.5 MG/DOSE,) 2 MG/3ML SOPN 0.5 mg injected subcutaneously once weekly   No Known Allergies  Past Medical History:  Diagnosis Date   Depression    Diabetes mellitus without complication (HCC)  Elevated LDL cholesterol level    Essential hypertension, benign 08/22/2013   Onychomycosis 03/08/2017   Onychomycosis of toenail 08/28/2016   Past Surgical History:  Procedure Laterality Date   BREAST BIOPSY Left 06/08/2008   Fat necrosis   CESAREAN SECTION  2009   Fetal macrosomia   CESAREAN SECTION  2010   Family History  Problem Relation Age of Onset   Diabetes Mother    Hyperlipidemia Mother    Hypertension Mother    Peripheral Artery Disease Mother    Diabetes Paternal Grandmother    Diabetes Father    Hyperlipidemia Father    Hypertension Father    Heart disease Father        PCTA and stent    Vascular Disease Father        Right Carotid endarterectomy   Diabetes Brother    Kidney disease Brother    Hypertension Brother    Anxiety disorder Daughter    Obesity Daughter    Autism spectrum disorder Son    ADD / ADHD Son    Anxiety disorder Son    Obesity Son    Breast cancer Neg Hx    Social History   Socioeconomic History   Marital status: Married    Spouse name: Floyde   Number of children: 2   Years of education: 12   Highest education level: Not on file  Occupational History   Occupation: Works daily for 6 of 7 days of week at [employer]  Tobacco Use   Smoking status: Never    Passive exposure: Never   Smokeless tobacco: Never  Vaping Use   Vaping status: Never Used  Substance and Sexual Activity   Alcohol use: Not Currently    Comment: occasional   Drug use: No   Sexual activity: Yes    Birth control/protection: None  Other Topics Concern   Not on file  Social History Narrative   Lives at home with husband,daughter, and   son      Social Drivers of Corporate investment banker Strain: Low Risk  (02/17/2024)   Overall Financial Resource Strain (CARDIA)    Difficulty of Paying Living Expenses: Not very hard  Food Insecurity: No Food Insecurity (02/17/2024)   Hunger Vital Sign    Worried About Running Out of Food in the Last Year: Never true    Ran Out of Food in the Last Year: Never true  Transportation Needs: No Transportation Needs (02/17/2024)   PRAPARE - Administrator, Civil Service (Medical): No    Lack of Transportation (Non-Medical): No  Physical Activity: Sufficiently Active (12/24/2017)   Exercise Vital Sign    Days of Exercise per Week: 7 days    Minutes of Exercise per Session: 50 min  Stress: Stress Concern Present (12/24/2017)   [employer] of Occupational Health - Occupational Stress Questionnaire    Feeling of Stress : Rather much  Social Connections: Unknown (04/21/2019)   Social Connection and Isolation Panel     Frequency of Communication with Friends and Family: Not on file    Frequency of Social Gatherings with Friends and Family: Not on file    Attends Religious Services: Not on file    Active Member of Clubs or Organizations: Not on file    Attends Banker Meetings: Not on file    Marital Status: Married  Intimate Partner Violence: Not At Risk (02/17/2024)   Humiliation, Afraid, Rape, and Kick questionnaire    Fear of Current  or Ex-Partner: No    Emotionally Abused: No    Physically Abused: No    Sexually Abused: No       Review of Systems  HENT:  Negative for dental problem.   Eyes:  Negative for visual disturbance.  Respiratory:  Negative for shortness of breath.   Cardiovascular:  Negative for chest pain, palpitations and leg swelling.  Skin:        Toenail fungus:  recurred.  Was treated last in 2018 with 12 weeks for Terbinafine .  States she was requiring everyone in home to wear sandals in shower to prevent reinfection. Not continuing to spray shoes.  Neurological:  Negative for weakness and numbness.  Psychiatric/Behavioral:  Negative for dysphoric mood. The patient is not nervous/anxious.       Objective:   BP 118/80 (BP Location: Left Arm, Patient Position: Sitting, Cuff Size: Normal)   Pulse 72   Resp 18   Ht 4' 9 (1.448 m)   Wt 136 lb (61.7 kg)   LMP 02/09/2024 (Exact Date)   BMI 29.43 kg/m   Physical Exam HENT:     Head: Normocephalic and atraumatic.     Right Ear: Tympanic membrane, ear canal and external ear normal.     Left Ear: Tympanic membrane, ear canal and external ear normal.     Nose: Nose normal.     Mouth/Throat:     Mouth: Mucous membranes are moist.     Pharynx: Oropharynx is clear.  Eyes:     Extraocular Movements: Extraocular movements intact.     Conjunctiva/sclera: Conjunctivae normal.     Pupils: Pupils are equal, round, and reactive to light.     Comments: Discs sharp  Neck:     Thyroid: No thyroid mass or thyromegaly.   Cardiovascular:     Rate and Rhythm: Normal rate and regular rhythm.     Pulses:          Dorsalis pedis pulses are 2+ on the right side and 2+ on the left side.       Posterior tibial pulses are 2+ on the right side and 2+ on the left side.     Heart sounds: S1 normal and S2 normal. Murmur (perhaps mild radiation to left carotid, right second interspace. Could not hear over right carotid.) heard.     Systolic murmur is present with a grade of 1/6.     No friction rub. No S3 or S4 sounds.     Comments: No carotid bruits.  Carotid, radial, femoral, DP and PT pulses normal and equal.   Pulmonary:     Effort: Pulmonary effort is normal.     Breath sounds: Normal breath sounds and air entry.  Chest:  Breasts:    Right: No inverted nipple, mass or nipple discharge.     Left: No inverted nipple, mass or nipple discharge.  Abdominal:     General: Bowel sounds are normal.     Palpations: Abdomen is soft. There is no hepatomegaly, splenomegaly or mass.     Tenderness: There is no abdominal tenderness.     Hernia: No hernia is present.  Genitourinary:    Comments: Thin white external and internal vaginal discharge with mild odor. Did have white adherent patches on cervix as well with minimal inflammation No uterine or adnexal mass or tenderness.   Musculoskeletal:        General: Normal range of motion.     Cervical back: Normal range of motion and neck supple.  Right lower leg: No edema.     Left lower leg: No edema.  Feet:     Right foot:     Protective Sensation: 10 sites tested.  10 sites sensed.     Toenail Condition: Right toenails are abnormally thick.     Left foot:     Protective Sensation: 10 sites tested.  10 sites sensed.     Toenail Condition: Left toenails are abnormally thick.     Comments: Flaking of plantar surface of both feet.  Toenails painted, but thickened appearing on cut edge. Lymphadenopathy:     Head:     Right side of head: No submental or submandibular  adenopathy.     Left side of head: No submental or submandibular adenopathy.     Cervical: No cervical adenopathy.     Upper Body:     Right upper body: No supraclavicular or axillary adenopathy.     Left upper body: No supraclavicular or axillary adenopathy.     Lower Body: No right inguinal adenopathy. No left inguinal adenopathy.  Skin:    General: Skin is warm.     Capillary Refill: Capillary refill takes less than 2 seconds.     Findings: No rash.  Neurological:     General: No focal deficit present.     Mental Status: She is alert and oriented to person, place, and time.     Cranial Nerves: Cranial nerves 2-12 are intact.     Sensory: Sensation is intact.     Motor: Motor function is intact.     Coordination: Coordination is intact.     Gait: Gait is intact.     Deep Tendon Reflexes: Reflexes are normal and symmetric.  Psychiatric:        Mood and Affect: Mood normal.        Speech: Speech normal.        Behavior: Behavior normal. Behavior is cooperative.      Assessment & Plan   CPE with pap Mammogram again in April FIT to return in 2 weeks. Spikevax  COVID vaccine  2.  DM:  return for fasting labs, including A1C, urine microalbumin/crea.  Increase Ozempic  to 1 mg weekly--she will start next Wednesday, October 8th and stop glipizide  that day.  To return in 3 weeks then with glucose log.    Eye referral  3.  Hyperlipidemia:  return for FLP  4.  BV and also yeast vaginitis:  The former appears more significantly, so will treat first with Metronidazole  x 7 days and then fluconazole  150 mg daily for 2 days afterward.    5.  Hypertension:  controlled  6.  Tinea Pedis:  Terbinafine  cream twice daily for 14 days or until flaking gone.  Will readdress toenails after DM stable on Ozempic .    7.  Need for Dental care:  dental clinic referral.

## 2024-02-19 LAB — CYTOLOGY - PAP

## 2024-02-26 ENCOUNTER — Other Ambulatory Visit: Payer: Self-pay

## 2024-02-26 DIAGNOSIS — Z0000 Encounter for general adult medical examination without abnormal findings: Secondary | ICD-10-CM

## 2024-02-26 DIAGNOSIS — E119 Type 2 diabetes mellitus without complications: Secondary | ICD-10-CM

## 2024-02-27 LAB — CBC WITH DIFFERENTIAL/PLATELET
Basophils Absolute: 0 x10E3/uL (ref 0.0–0.2)
Basos: 1 %
EOS (ABSOLUTE): 0.1 x10E3/uL (ref 0.0–0.4)
Eos: 3 %
Hematocrit: 43.2 % (ref 34.0–46.6)
Hemoglobin: 13.9 g/dL (ref 11.1–15.9)
Immature Grans (Abs): 0 x10E3/uL (ref 0.0–0.1)
Immature Granulocytes: 0 %
Lymphocytes Absolute: 2.5 x10E3/uL (ref 0.7–3.1)
Lymphs: 43 %
MCH: 29.4 pg (ref 26.6–33.0)
MCHC: 32.2 g/dL (ref 31.5–35.7)
MCV: 92 fL (ref 79–97)
Monocytes Absolute: 0.4 x10E3/uL (ref 0.1–0.9)
Monocytes: 8 %
Neutrophils Absolute: 2.6 x10E3/uL (ref 1.4–7.0)
Neutrophils: 45 %
Platelets: 393 x10E3/uL (ref 150–450)
RBC: 4.72 x10E6/uL (ref 3.77–5.28)
RDW: 12.3 % (ref 11.7–15.4)
WBC: 5.7 x10E3/uL (ref 3.4–10.8)

## 2024-02-27 LAB — HEMOGLOBIN A1C
Est. average glucose Bld gHb Est-mCnc: 160 mg/dL
Hgb A1c MFr Bld: 7.2 % — ABNORMAL HIGH (ref 4.8–5.6)

## 2024-02-27 LAB — LIPID PANEL W/O CHOL/HDL RATIO
Cholesterol, Total: 135 mg/dL (ref 100–199)
HDL: 55 mg/dL (ref >39)
LDL Chol Calc (NIH): 65 mg/dL (ref 0–99)
Triglycerides: 75 mg/dL (ref 0–149)
VLDL Cholesterol Cal: 15 mg/dL (ref 5–40)

## 2024-02-27 LAB — TSH: TSH: 3.3 u[IU]/mL (ref 0.450–4.500)

## 2024-02-27 LAB — MICROALBUMIN / CREATININE URINE RATIO
Creatinine, Urine: 73.2 mg/dL
Microalb/Creat Ratio: 7 mg/g{creat} (ref 0–29)
Microalbumin, Urine: 5 ug/mL

## 2024-03-07 ENCOUNTER — Telehealth: Payer: Self-pay | Admitting: Internal Medicine

## 2024-03-07 ENCOUNTER — Other Ambulatory Visit: Payer: Self-pay

## 2024-03-07 NOTE — Telephone Encounter (Signed)
 Patient stopped by today , patient asked if she was asked to stop Metformin  and Glipizide  after starting her new dose of 1mg  of Ozempic .   Patient states she stopped both but noticed her sugars are too high, but does not know if is due to her taking syrups for cold symptoms that she was having.   Notified patient based on last office visit , doctor states in there to stop glipizide  the day of starting 1mg  of ozempic . But does not seem to say Metformin .   Notified patient I will ask doctor to clarify .

## 2024-03-10 NOTE — Telephone Encounter (Signed)
 Per Dr. Adella patient  is only to stop Glipizide  and not Metformin . Patient needs to continue taking metformin . Also, patient needs to be careful with taking syrups as they may contain a lot of sugar.   --called patient , no answer left a voicemail asking to return call.

## 2024-03-18 ENCOUNTER — Other Ambulatory Visit: Payer: Self-pay

## 2024-03-18 VITALS — Wt 135.0 lb

## 2024-03-18 DIAGNOSIS — E119 Type 2 diabetes mellitus without complications: Secondary | ICD-10-CM

## 2024-03-18 NOTE — Progress Notes (Signed)
 Stay on same dose as only at 3 weeks with 1 mg. Follow up in 2 weeks to decide if should take up to 2 mg with Ozempic  Stay on Metformin . Sugars mid 100s in AM for most part and high 100s in evening.

## 2024-03-18 NOTE — Progress Notes (Signed)
 Patient reports that she is taking Jardiance  daily, metformin  BID, and Ozempic  1mg . Patient has been on Ozempic  1mg  weekly for about 3 weeks. Patient restarted Metformin  about 1 week ago after previous confusion about stopping medication.  Patient will return in 2 weeks for weight and sugar check.

## 2024-03-19 ENCOUNTER — Ambulatory Visit: Payer: Self-pay | Admitting: Internal Medicine

## 2024-03-21 NOTE — Progress Notes (Signed)
 The patient is notified.

## 2024-03-23 ENCOUNTER — Other Ambulatory Visit: Payer: Self-pay | Admitting: Internal Medicine

## 2024-04-04 ENCOUNTER — Ambulatory Visit: Payer: Self-pay | Admitting: Internal Medicine

## 2024-04-04 NOTE — Telephone Encounter (Signed)
 Please call 3 separate times and document.  If she does not respond, as per our policy, send out a letter to call

## 2024-04-04 NOTE — Telephone Encounter (Signed)
 Spoken with patient, patient states she was notified on her last weight check appointment on 03/18/24 by CMA Blane, she was told that she was only to stop Glipizide  and continue taking Metformin ,

## 2024-04-08 ENCOUNTER — Other Ambulatory Visit (INDEPENDENT_AMBULATORY_CARE_PROVIDER_SITE_OTHER): Payer: Self-pay

## 2024-04-08 VITALS — Wt 134.0 lb

## 2024-04-08 DIAGNOSIS — E119 Type 2 diabetes mellitus without complications: Secondary | ICD-10-CM

## 2024-04-08 NOTE — Progress Notes (Signed)
 Patient confirmed that she is taking Jardiance  10mg  daily, Metformin  1000mg  twice daily, and Ozempic  1mg  weekly. Patients sugars are generally in the 140s and occasionally spike up to 200s. Patient admitted that she has been missing Metformin  occasionally and has had a bad diet.    The patient instructed to keep her diet healthy and there is no change in medication now.

## 2024-04-08 NOTE — Progress Notes (Signed)
 Lost 1 lb Sugars range 98-145 in AM Sugars range 148-207 in PM for last week Not able to wean Metformin  yet Needs to pay attention to what she is eating during day and also whether she is getting any physical activity.

## 2024-04-20 ENCOUNTER — Other Ambulatory Visit: Payer: Self-pay | Admitting: Internal Medicine

## 2024-05-27 ENCOUNTER — Other Ambulatory Visit (INDEPENDENT_AMBULATORY_CARE_PROVIDER_SITE_OTHER): Payer: Self-pay

## 2024-05-27 NOTE — Progress Notes (Unsigned)
 Today weigh = 133lb  Last time = 134lb  Sugars   AM = The patient takes Jardiance  10 mg , Metformin  1000mg  , ozemoic 1mg . She has been on it for about two months   NOTE; she does not eat well which explains spikes on her sugar log Ask if welling to increase to two ? If yes sent it  Back in two weeks if not stay on the same

## 2024-06-10 ENCOUNTER — Other Ambulatory Visit: Payer: Self-pay

## 2024-06-10 MED ORDER — SEMAGLUTIDE (2 MG/DOSE) 8 MG/3ML ~~LOC~~ SOPN: 2.0000 mg | PEN_INJECTOR | SUBCUTANEOUS | 11 refills | Status: DC

## 2024-06-10 NOTE — Progress Notes (Signed)
 Patient is taking Metformin  1000 mg BID, jardiance  10 mg daily, and Ozempic  1mg  weekly. Has been on ozempic  1mg  for about 3 months. Patients morning sugars continue to be around 170 and afternoon about 150. Patient reports that she has not made lifestyle changes.

## 2024-06-10 NOTE — Progress Notes (Signed)
 Sugars in AM:  100-223 Sugars in PM:  80-182 Increase Ozempic  to 2 mg weekly Needs to document what happened with eating and physical activity on days her sugars go significantly lower or higher.   (Anything outside of 100 to 190) Want her to go over nutrition with Dr. Fleta

## 2024-06-15 NOTE — Progress Notes (Addendum)
 Erminio interpreting The patient is notified but the patient says that she is not welling to increase to 2 mg because she does not feel ok. She says that she wants to talk about it with Dr on her next coming appointment. About documenting the reasons of her blood  picks , she says she barely finds time to check her blood daily and she may not be able to document

## 2024-06-21 ENCOUNTER — Other Ambulatory Visit: Payer: Self-pay

## 2024-06-21 DIAGNOSIS — E119 Type 2 diabetes mellitus without complications: Secondary | ICD-10-CM

## 2024-06-21 MED ORDER — SEMAGLUTIDE (1 MG/DOSE) 4 MG/3ML ~~LOC~~ SOPN: 1.0000 mg | PEN_INJECTOR | SUBCUTANEOUS | 11 refills | Status: AC

## 2024-06-22 LAB — HGB A1C W/O EAG: Hgb A1c MFr Bld: 7.3 % — ABNORMAL HIGH (ref 4.8–5.6)

## 2024-06-23 ENCOUNTER — Ambulatory Visit: Payer: Self-pay | Admitting: Internal Medicine

## 2024-06-23 ENCOUNTER — Encounter: Payer: Self-pay | Admitting: Internal Medicine

## 2024-06-23 VITALS — BP 125/70 | HR 68 | Resp 14 | Ht <= 58 in | Wt 134.0 lb

## 2024-06-23 DIAGNOSIS — E119 Type 2 diabetes mellitus without complications: Secondary | ICD-10-CM

## 2024-06-23 NOTE — Progress Notes (Unsigned)
" ° ° °  Subjective:    Patient ID: Alyssa Garrison, female   DOB: April 09, 1976, 49 y.o.   MRN: [id number]   HPI  Erminio Bloomer interprets.    DM:  A1C is about the same at 7.3%.  She does not want to increase Ozempic  as discussed previously.  She has not made huge changes with her diet and physical activity.  Needs to leave for work  Active Medications[1] Allergies[2]   Review of Systems    Objective:   BP 125/70   Pulse 68   Resp 14   Ht 4' 9 (1.448 m)   Wt 134 lb (60.8 kg)   LMP 06/15/2024   BMI 29.00 kg/m   Physical Exam   Assessment & Plan  She will get her eyes checked and let me know what she wants to do.  She is trying in past 3 days to work on diet, but describes drinking juice and soda.  She will consider Ozempic  increase after eye check Right now, would like to work on lifestyle changes    [1]  Current Meds  Medication Sig   aspirin  81 MG tablet Take 1 tablet (81 mg total) by mouth daily.   atorvastatin  (LIPITOR) 20 MG tablet TAKE 1 TABLET BY MOUTH ONCE DAILY WITH EVENING MEAL   empagliflozin  (JARDIANCE ) 10 MG TABS tablet Take 1 tablet (10 mg total) by mouth daily before breakfast.   ferrous gluconate  (FERGON) 324 MG tablet Take 1 tablet (324 mg total) by mouth 2 (two) times daily with a meal.   glucose blood (AGAMATRIX PRESTO TEST) test strip Check sugars twice daily before meals   hydrochlorothiazide  (HYDRODIURIL ) 25 MG tablet Take 1 tablet by mouth once daily   metFORMIN  (GLUCOPHAGE ) 1000 MG tablet TAKE 1 TABLET BY MOUTH TWICE DAILY WITH A MEAL   Semaglutide , 1 MG/DOSE, 4 MG/3ML SOPN Inject 1 mg as directed once a week.   terbinafine  (LAMISIL ) 1 % cream Apply 1 Application topically 2 (two) times daily.  [2] No Known Allergies  "

## 2024-07-22 ENCOUNTER — Other Ambulatory Visit: Payer: Self-pay

## 2024-08-26 ENCOUNTER — Other Ambulatory Visit: Payer: Self-pay

## 2024-09-23 ENCOUNTER — Other Ambulatory Visit: Payer: Self-pay

## 2024-09-27 ENCOUNTER — Ambulatory Visit: Payer: Self-pay | Admitting: Internal Medicine

## 2025-02-16 ENCOUNTER — Other Ambulatory Visit: Payer: Self-pay | Admitting: Internal Medicine

## 2025-02-23 ENCOUNTER — Encounter: Payer: Self-pay | Admitting: Internal Medicine
